# Patient Record
Sex: Female | Born: 1950 | Race: Black or African American | Hispanic: No | Marital: Married | Smoking: Former smoker
Health system: Southern US, Community
[De-identification: ages and names within clinical notes are randomized; demographics above are authoritative.]

## PROBLEM LIST (undated history)

## (undated) DIAGNOSIS — I10 Essential (primary) hypertension: Secondary | ICD-10-CM

## (undated) DIAGNOSIS — I2510 Atherosclerotic heart disease of native coronary artery without angina pectoris: Secondary | ICD-10-CM

## (undated) DIAGNOSIS — F32A Depression, unspecified: Secondary | ICD-10-CM

## (undated) DIAGNOSIS — F329 Major depressive disorder, single episode, unspecified: Secondary | ICD-10-CM

## (undated) DIAGNOSIS — F319 Bipolar disorder, unspecified: Secondary | ICD-10-CM

## (undated) DIAGNOSIS — F419 Anxiety disorder, unspecified: Secondary | ICD-10-CM

## (undated) HISTORY — PX: ABDOMINAL HYSTERECTOMY: SHX81

## (undated) HISTORY — DX: Atherosclerotic heart disease of native coronary artery without angina pectoris: I25.10

---

## 2000-08-04 ENCOUNTER — Other Ambulatory Visit: Admission: RE | Admit: 2000-08-04 | Discharge: 2000-08-04 | Payer: Self-pay | Admitting: Obstetrics and Gynecology

## 2001-03-22 ENCOUNTER — Encounter: Payer: Self-pay | Admitting: Pulmonary Disease

## 2001-03-22 ENCOUNTER — Ambulatory Visit (HOSPITAL_COMMUNITY): Admission: RE | Admit: 2001-03-22 | Discharge: 2001-03-22 | Payer: Self-pay | Admitting: Pulmonary Disease

## 2001-08-17 ENCOUNTER — Other Ambulatory Visit: Admission: RE | Admit: 2001-08-17 | Discharge: 2001-08-17 | Payer: Self-pay | Admitting: Obstetrics and Gynecology

## 2001-09-07 ENCOUNTER — Encounter: Payer: Self-pay | Admitting: Obstetrics and Gynecology

## 2001-09-07 ENCOUNTER — Encounter: Admission: RE | Admit: 2001-09-07 | Discharge: 2001-09-07 | Payer: Self-pay | Admitting: Obstetrics and Gynecology

## 2001-10-18 ENCOUNTER — Encounter: Payer: Self-pay | Admitting: *Deleted

## 2001-10-31 ENCOUNTER — Ambulatory Visit (HOSPITAL_COMMUNITY): Admission: RE | Admit: 2001-10-31 | Discharge: 2001-10-31 | Payer: Self-pay | Admitting: *Deleted

## 2002-03-20 ENCOUNTER — Inpatient Hospital Stay (HOSPITAL_COMMUNITY): Admission: EM | Admit: 2002-03-20 | Discharge: 2002-03-27 | Payer: Self-pay | Admitting: Psychiatry

## 2003-07-25 ENCOUNTER — Inpatient Hospital Stay (HOSPITAL_COMMUNITY): Admission: EM | Admit: 2003-07-25 | Discharge: 2003-07-31 | Payer: Self-pay | Admitting: Psychiatry

## 2004-07-28 ENCOUNTER — Ambulatory Visit: Payer: Self-pay | Admitting: Psychiatry

## 2004-07-28 ENCOUNTER — Inpatient Hospital Stay (HOSPITAL_COMMUNITY): Admission: RE | Admit: 2004-07-28 | Discharge: 2004-08-05 | Payer: Self-pay | Admitting: Psychiatry

## 2005-02-02 ENCOUNTER — Encounter (INDEPENDENT_AMBULATORY_CARE_PROVIDER_SITE_OTHER): Payer: Self-pay | Admitting: Cardiology

## 2005-02-02 ENCOUNTER — Ambulatory Visit (HOSPITAL_COMMUNITY): Admission: RE | Admit: 2005-02-02 | Discharge: 2005-02-02 | Payer: Self-pay | Admitting: Pulmonary Disease

## 2005-06-05 ENCOUNTER — Inpatient Hospital Stay (HOSPITAL_COMMUNITY): Admission: RE | Admit: 2005-06-05 | Discharge: 2005-06-10 | Payer: Self-pay | Admitting: Psychiatry

## 2005-06-06 ENCOUNTER — Ambulatory Visit: Payer: Self-pay | Admitting: Psychiatry

## 2006-10-26 ENCOUNTER — Ambulatory Visit: Payer: Self-pay | Admitting: *Deleted

## 2006-10-26 ENCOUNTER — Inpatient Hospital Stay (HOSPITAL_COMMUNITY): Admission: RE | Admit: 2006-10-26 | Discharge: 2006-11-02 | Payer: Self-pay | Admitting: *Deleted

## 2007-08-03 ENCOUNTER — Inpatient Hospital Stay (HOSPITAL_COMMUNITY): Admission: EM | Admit: 2007-08-03 | Discharge: 2007-08-05 | Payer: Self-pay | Admitting: Emergency Medicine

## 2007-08-04 ENCOUNTER — Encounter (INDEPENDENT_AMBULATORY_CARE_PROVIDER_SITE_OTHER): Payer: Self-pay | Admitting: Cardiovascular Disease

## 2008-07-29 ENCOUNTER — Encounter: Admission: RE | Admit: 2008-07-29 | Discharge: 2008-07-29 | Payer: Self-pay | Admitting: Orthopedic Surgery

## 2008-08-01 ENCOUNTER — Encounter: Admission: RE | Admit: 2008-08-01 | Discharge: 2008-08-01 | Payer: Self-pay | Admitting: Orthopedic Surgery

## 2008-09-17 IMAGING — US US ABDOMEN COMPLETE
1 series · 14 of 25 positions shown · non-contrast
Comparison: none

CLINICAL DATA: 56-year-old female, abdominal and chest pain. 
ABDOMEN ULTRASOUND:
TECHNIQUE: Complete abdominal ultrasound examination was performed including evaluation of the liver, gallbladder, bile ducts, pancreas, kidneys, spleen, IVC, and abdominal aorta.

[Series 1: unknown · 0.30mm/px · 14 of 58 slices shown]
[im 1/58]
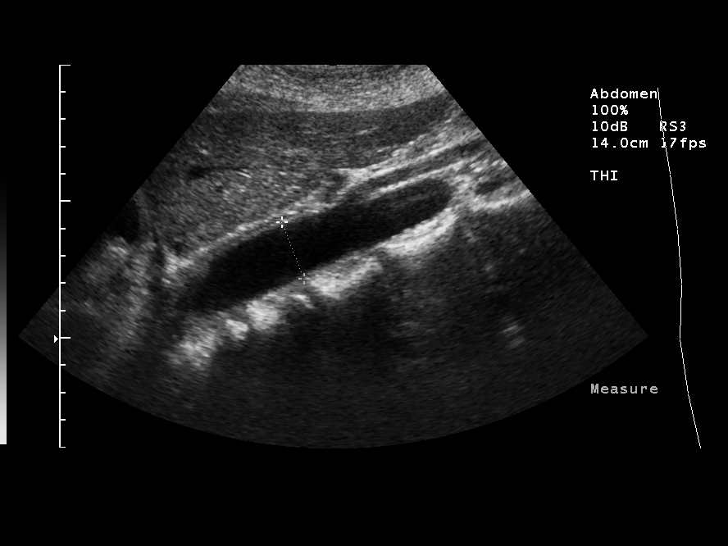
[im 5/58]
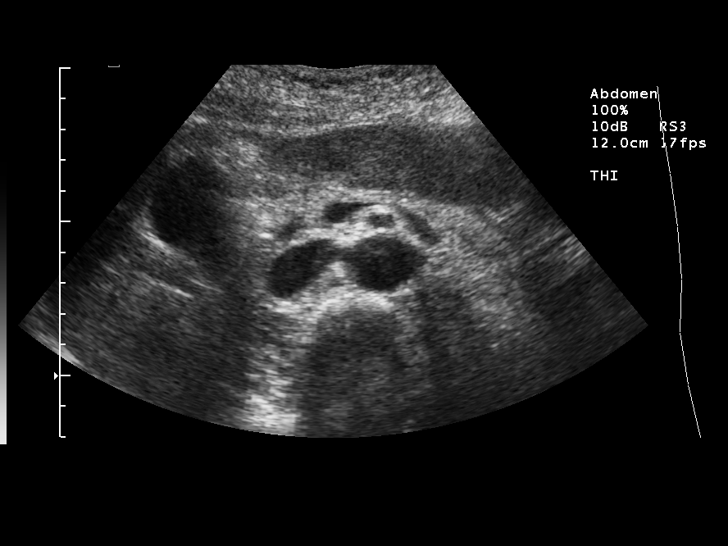
[im 10/58]
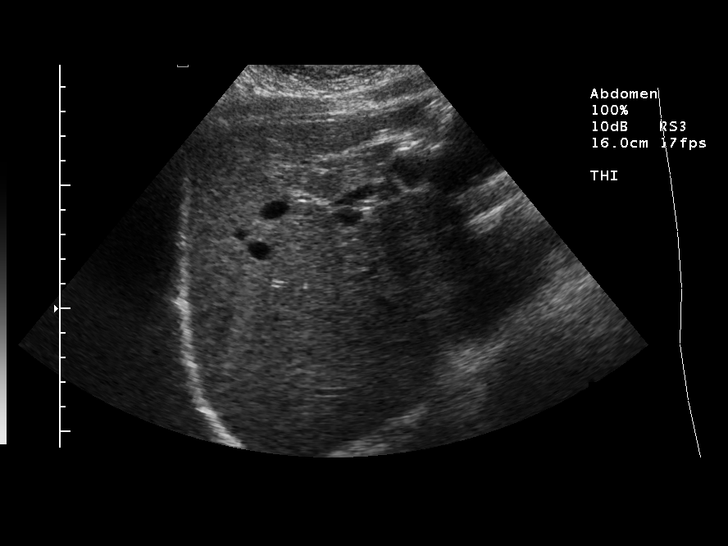
[im 15/58]
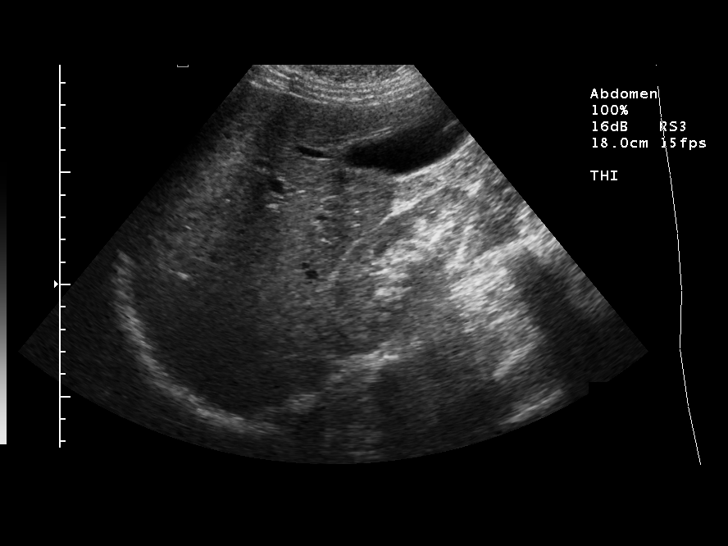
[im 20/58]
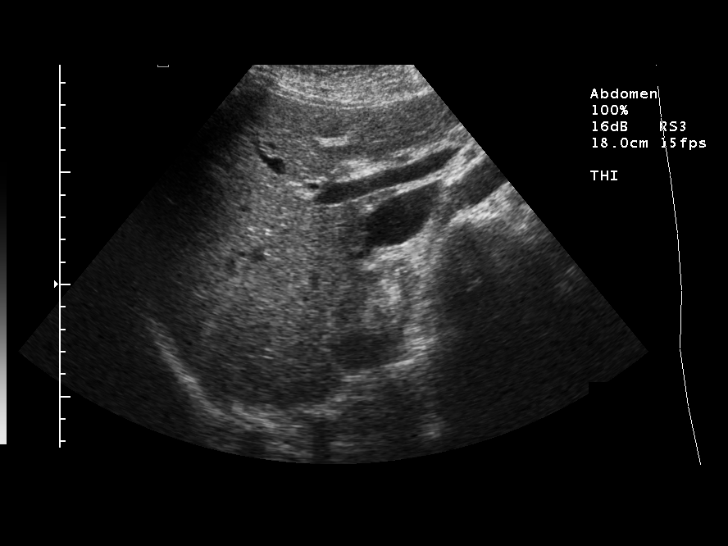
[im 22/58]
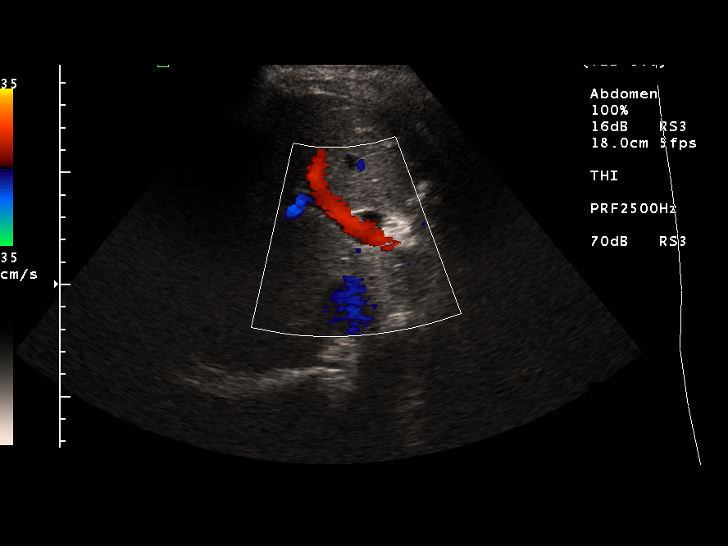
[im 27/58]
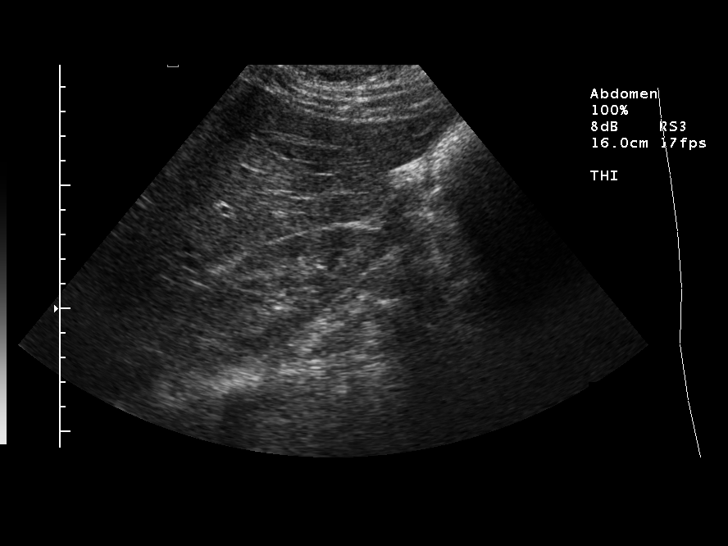
[im 31/58]
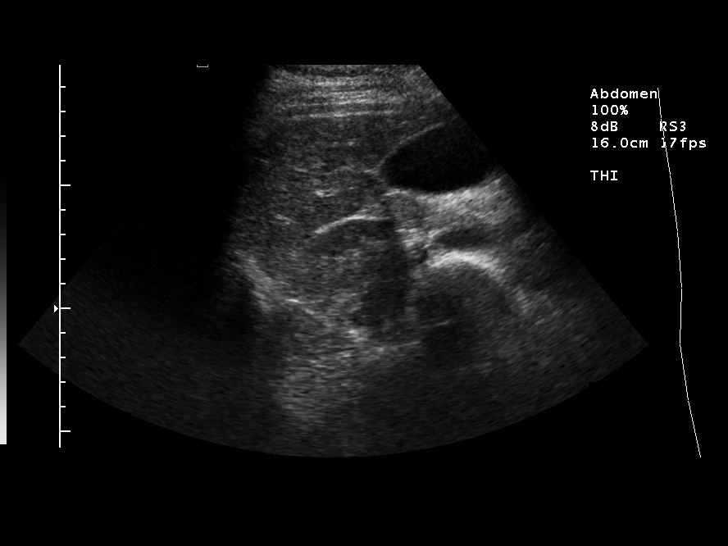
[im 36/58]
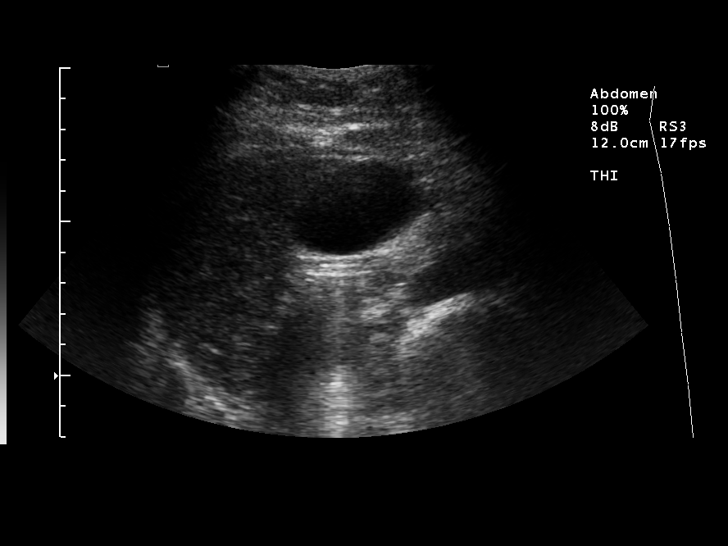
[im 39/58]
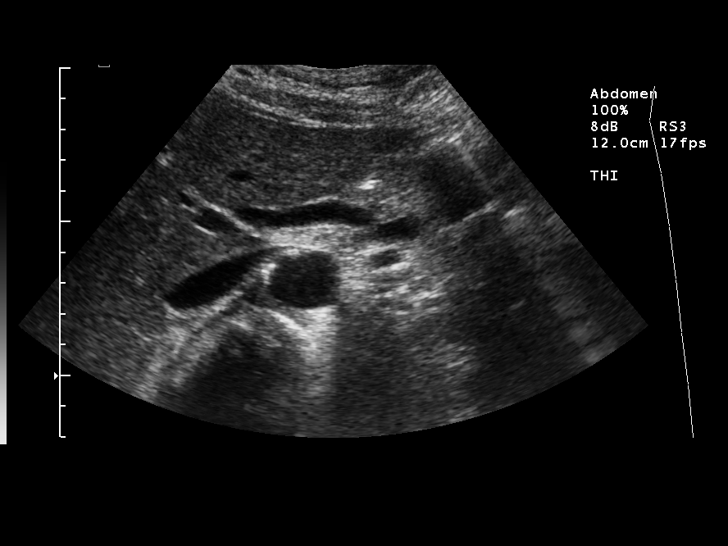
[im 43/58]
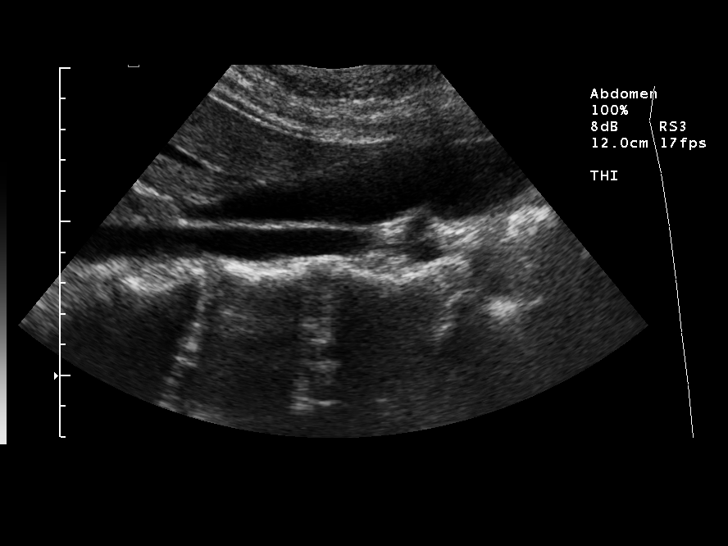
[im 48/58]
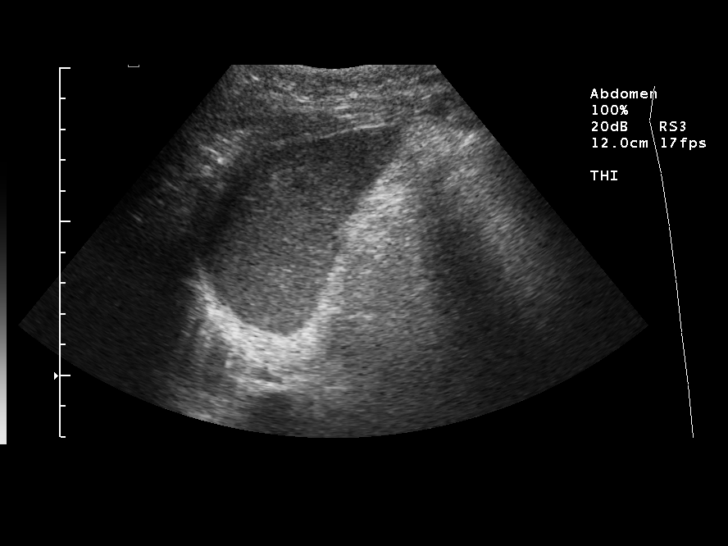
[im 53/58]
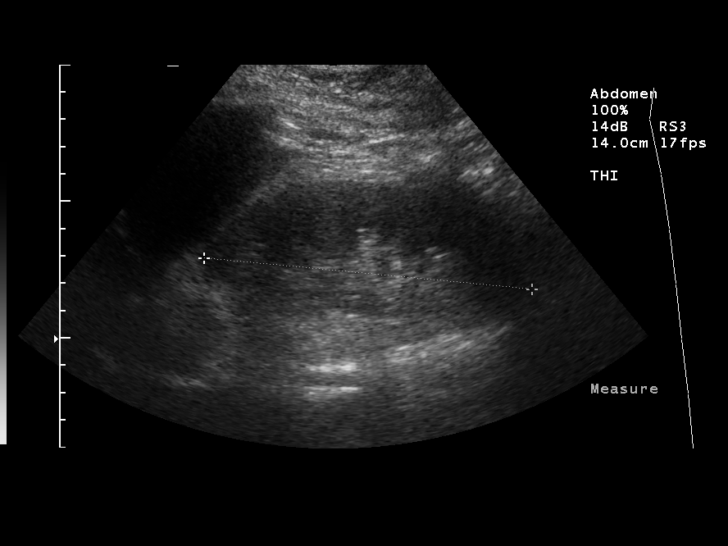
[im 58/58]
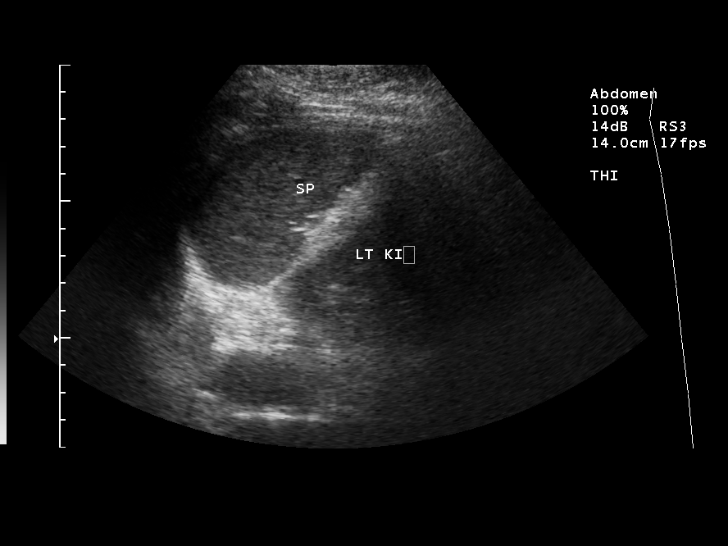

[14 of 25 positions shown; findings below may reference images not displayed]

FINDINGS: Gallbladder is normal in appearance without stone disease, wall thickening, or pericholecystic fluid.  Negative for sonographic Murphy sign.  Common bile duct diameter is normal measuring 6.4 mm.  Imaged portions of the liver, IVC, spleen, kidneys, and aorta are within normal limits.  No focal hepatic abnormality, ascites, or acute finding.  The kidneys demonstrate no evidence of hydronephrosis, dilatation, or perinephric fluid collection.  Slight prominence of the distal CBD in the pancreatic head region again measuring 6 mm. Imaging of the left kidney was limited because of difficulty obtaining a good acoustic window.
IMPRESSION: 1.  No acute finding.  Negative for gallstones or cholecystitis.  
2.  Prominent extrahepatic CBD in the pancreatic head region measuring 6 mm, nonspecific.

## 2010-04-20 ENCOUNTER — Other Ambulatory Visit: Admission: RE | Admit: 2010-04-20 | Discharge: 2010-04-20 | Payer: Self-pay | Admitting: Radiology

## 2011-02-09 NOTE — Cardiovascular Report (Signed)
Emily Murray, Emily Murray          ACCOUNT NO.:  [id number]   MEDICAL RECORD NO.:  [id number]          PATIENT TYPE:  INP   LOCATION:  2021                         FACILITY:  [facility]   PHYSICIAN:  Ricki Rodriguez, M.D.  DATE OF BIRTH:  1951/07/17   DATE OF PROCEDURE:  08/04/2007  DATE OF DISCHARGE:  08/05/2007                            CARDIAC CATHETERIZATION   REFERRING PHYSICIAN:  Dr. Corine Shelter.   PROCEDURE:  1. Left heart catheterization.  2. Selective coronary angiography.  3. Left ventricular function study.   INDICATIONS:  This 60 year old black female with a history of mild  hypertension not treated with any medications yet, had atypical chest  pain, abnormal EKG and abnormal nuclear stress test showing inferior  wall ischemia.   APPROACH:  Right femoral artery using 4-French sheath.   COMPLICATIONS:  None.   DYE:  Less than 40 mL of dye were used.   HEMODYNAMIC DATA:  The left ventricular pressure was 148/5 and aortic  pressure was 150/90.   CORONARY ANATOMY:  The left main coronary artery was very short and  unremarkable.   Left anterior descending coronary artery:  The left anterior descending  coronary artery had a proximal ectasia.  The mid and distal vessel was  unremarkable.  The distal vessel wrapped around the apex of the heart,  supplying 1/3 of the posterior septum.  The diagonal vessels were  unremarkable.   Left circumflex coronary artery:  The left circumflex coronary artery  was essentially unremarkable.  Its ramus artery was a very narrow vessel  and otherwise was unremarkable.  Its obtuse marginal branch #1 was a  small vessel and obtuse marginal branch #2 was unremarkable.   Right coronary artery:  The right coronary artery was dominant and  essentially unremarkable and its posterolateral branch and posterior  descending coronary arteries were also unremarkable.   LEFT VENTRICULOGRAM:  The left ventriculogram showed mild basal  inferior  wall hypokinesia with minimal dilatation of the left ventricle, ejection  fraction about 50% to 55%.   IMPRESSION:  1. Normal coronaries.  2. Preserved left ventricular systolic function.  3. Early hypertensive heart disease.   RECOMMENDATIONS:  This patient will be treated medically with addition  of ACE inhibitor or an ARB medication for blood pressure control.      Ricki Rodriguez, M.D.  Electronically Signed     ASK/MEDQ  D:  08/04/2007  T:  08/06/2007  Job:  [id number]

## 2011-02-12 NOTE — Discharge Summary (Signed)
Emily Murray, Emily Murray          ACCOUNT NO.:  [id number]   MEDICAL RECORD NO.:  [id number]          PATIENT TYPE:  INP   LOCATION:  2021                         FACILITY:  [facility]   PHYSICIAN:  Ricki Rodriguez, M.D.  DATE OF BIRTH:  1950-10-26   DATE OF ADMISSION:  08/03/2007  DATE OF DISCHARGE:  08/05/2007                               DISCHARGE SUMMARY   REFERRING PHYSICIAN:  Mina Marble, M.D.   FINAL DIAGNOSES:  1. Chest pain.  2. Hypertensive heart disease.  3. Manic-depressive disorder.   PRINCIPAL PROCEDURE:  Left heart catheterization, selective coronary  angiography, left ventricular function study done by Dr. Orpah Cobb on  August 04, 2007.   DISCHARGE MEDICATIONS:  1. Zoloft 100 mg one daily.  2. Premarin 0.625 mg one daily.  3. Risperdal 1 mg at bedtime.  4. Benicar HCT 20/12.5 mg half a tablet daily.   DISCHARGE ACTIVITY:  The patient to increase activity slowly.   DISCHARGE DIET:  Low-sodium heart-healthy diet with 1500 mL fluid  restriction per day.   SPECIAL INSTRUCTIONS:  The patient to stop any activity that causes  chest pain, shortness of breath, dizziness, sweating or excessive  weakness and decrease salt intake by 50% fluid and fluids by 30% of the  previous intact.   FOLLOWUP:  By Dr. Orpah Cobb in 2 weeks to 4 weeks and by Dr.  Petra Kuba in 1 month.   CONDITION ON DISCHARGE:  Improved.   HISTORY:  This 60 year old black female presented with atypical chest  pain described as retrosternal heaviness to indigestion along with a  right shoulder pain.   PHYSICAL EXAMINATION:  Temperature 97.6, pulse 65, respirations 20,  blood pressure 156/96, height 5 feet 3 inches, weight 140 pounds.  GENERAL:  The patient is averagely built and nourished, alert, oriented  x3.  HEENT:  The patient is normocephalic, atraumatic, has brown eyes.  Conjunctivae pink.  Sclerae nonicteric.  NECK:  No JVD, no carotid bruit.  Has full range of motion of  the neck.  LUNGS:  Clear bilaterally.  CHEST WALL:  Tender on palpation.  HEART:  Normal S1, S2.  ABDOMEN:  Soft and nontender.  EXTREMITIES:  Negative edema, cyanosis, clubbing.  SKIN:  Warm and dry.  NEUROLOGICALLY:  The patient moves all extremities.   LABORATORY DATA:  Normal hemoglobin/hematocrit, wbc count and platelet  count.  Normal electrolytes, BUN, creatinine.  Normal CK-MB and troponin  I.  Ultrasound of the abdomen revealed a prominent extrahepatic common  bile duct without evidence of gallstones or cholecystitis.  Chest x-ray:  No evidence of acute cardiopulmonary disease.  Nuclear stress test  revealed mild to moderate reversible ischemia in the inferior wall with  an ejection fraction of 52%.  Ultrasound of the heart revealed normal LV  systolic function with mild diastolic dysfunction, moderate tricuspid  regurgitation, mild mitral valve regurgitation.   Cardiac catheterization showed mild basal inferior wall hypokinesia,  otherwise normal coronaries.   HOSPITAL COURSE:  The patient was admitted to telemetry unit.  Myocardial infarction was ruled out.  She underwent nuclear stress test  that showed inferior wall ischemia.  Hence, she underwent cardiac  catheterization.  This showed normal coronaries.  However, there was  some evidence of basal inferior wall hypokinesia with an ejection  fraction of 50-55%.  The patient had uneventful stay and was given  instruction on salt intake reduction, controlling of fluid intake.  She  was started on a low dose of ACE inhibitor and she will be followed by  me and by primary care physician in 2-4 weeks.      Ricki Rodriguez, M.D.  Electronically Signed     ASK/MEDQ  D:  09/01/2007  T:  09/01/2007  Job:  [id number]
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
## 2011-02-12 NOTE — H&P (Signed)
[facility]  Patient:    Emily Murray, Emily Murray Visit Number: [id number] MRN: [id number]          Service Type: Attending:  Katy Fitch, M.D. Dictated by:   Katy Fitch, M.D. Adm. Date:  10/16/01                           History and Physical  CHIEF COMPLAINT:              Pelvic pain.  HISTORY OF PRESENT ILLNESS:   The patient is a 60 year old G4, P2, A2 who comes in to the office on January 10 noted to have left lower quadrant pain off and on for two months.  Patient was previously treated by Dr. Katrinka Blazing who has subsequently retired.  The patient previously at Dr. Lonn Georgia office was noted to have a cyst on her left ovary and was given pain medicine and it had resolved.  The patient states that this current pain has continued and has not improved with any type of movement, Tylenol, pain medicine, or moving her bowel or bladder.  The patient underwent ultrasound in the office.  Noted to have a left ovary which was higher in the abdomen.  Noted to be 3 x 1.5 x 1.3 which also had an echo-free cystic structure.  Secondary to this a CA-125 level was drawn which was noted to be 6.8.  Patient has opted to have definitive therapy and removal of her left tube and ovary currently.  PAST MEDICAL HISTORY:         Bipolar disorder.  PAST SURGICAL HISTORY:        Hysterectomy for fibroids in 1985, cyst removal in 1985 on the right side, ovary was removed on the right side in 1988.  MEDICATIONS:                  Zoloft, Depakote, ______ for her bipolar disorder.  ALLERGIES:                    No known drug allergies.  SOCIAL HISTORY:               Patient denies any tobacco, alcohol, or drugs.  FAMILY HISTORY:               Without any mental retardation, colon, breast, or ovarian cancer.  PHYSICAL EXAMINATION  VITAL SIGNS:                  Blood pressure 122/70.  HEENT:                        Throat:  Clear.  LUNGS:                        Clear  to auscultation bilaterally.  HEART:                        Regular rate and rhythm.  ABDOMEN:                      Soft, nontender.  PELVIC:                       Normal external genitalia.  Vaginal cuff is intact.  Right adnexa without tenderness.  However, left adnexa was extremely tender on deep palpation.  PLAN:  Laparoscopic left salpingo-oophorectomy. Patient was explained we will attempt to remove the ovary and tube laparoscopically.  However, due to previous surgery she potentially could have scar tissue which could impede removal through the scope and she has given [place] permission to do laparotomy.  Risks for the surgery include bleeding, transfusions, risk of hepatitis or HIV, infection, scar tissue, wound breakdown, damage to internal organs, fistula formation, blood clots, stroke, pulmonary embolism, or death.  The patient states she understands this and desires to proceed.  Will proceed on the 23rd. Dictated by:   Katy Fitch, M.D. Attending:  Katy Fitch, M.D. DD:  10/16/01 TD:  10/16/01 Job: [id number]

## 2011-02-12 NOTE — Discharge Summary (Signed)
NAME:  Emily Murray, Emily Murray                    ACCOUNT NO.:  [id number]   MEDICAL RECORD NO.:  [id number]                   PATIENT TYPE:  PS   LOCATION:  0506                                 FACILITY:  [facility]   PHYSICIAN:  Haskel Khan, M.D.           DATE OF BIRTH:  [birth year]   DATE OF ADMISSION:  03/20/2002  DATE OF DISCHARGE:  03/27/2002                                 DISCHARGE SUMMARY   IDENTIFYING DATA:  This is a 60 year old married African-American female  voluntarily admitted with a history of depression, unable to work, dropped  out of school, having thoughts of hurting others.  Husband reportedly doing  drugs.   PAST PSYCHIATRIC HISTORY:  The patient had been followed by Dr. Wynonia Lawman last  year and was hospitalized at [facility] two years ago with depression.  Possible history of bipolar disorder.   MEDICATIONS:  Premarin, Depakote, Zoloft.   ALLERGIES:  No known drug allergies.   PHYSICAL EXAMINATION:  Essentially within normal limits.  Neurologically  nonfocal.   LABORATORY DATA:  Routine admission labs within normal limits.   MENTAL STATUS EXAM:  Alert, cooperative female maintaining fair eye contact.  Speech within normal limits.  Mood depressed.  Affect restricted.  Thought  processes goal directed.  Thought content negative for dangerous ideation or  psychotic symptoms.  Cognitively intact.  Judgment and insight poor.   ADMISSION DIAGNOSES:   AXIS I:  Bipolar disorder, depressed.   AXIS II:  None.   AXIS III:  None.   AXIS IV:  Moderate (problems with primary support group).   AXIS V:  30/75.   HOSPITAL COURSE:  The patient was admitted and ordered routine p.r.n.  medications and underwent further monitoring.  The patient was resumed on  medications.  Zoloft was tapered due to possible mood swings and Depakote  optimized.  Depakote level was monitored and she was monitored for tolerance  and response.  Ativan was optimized to control acute  anxiety.  Family  session was held.  The patient reported some sedation with Zyprexa.   CONDITION ON DISCHARGE:  Improved.  Mood is more stable.  Affect brighter.  Thought processes goal directed.  Thought content negative for dangerous  ideation or psychotic symptoms.  The patient had improved judgment and  insight and reported motivation to be compliant with follow-up plan.    DISCHARGE MEDICATIONS:  1. Premarin.  2. Zoloft 100 mg q.a.m.  3. Depakote ER 500 mg, 2 q.h.s.  4. Ativan 0.5 mg, 1 t.i.d. and 2 q.h.s.  5. Tylenol No. 3, 1-2 every six hours p.r.n. pain.   FOLLOW UP:  Dr. Wynonia Lawman within one week after discharge.   DISCHARGE DIAGNOSES:   AXIS I:  Bipolar disorder, depressed.   AXIS II:  None.   AXIS III:  None.   AXIS IV:  Moderate (problems with primary support group).   AXIS V:  Global Assessment of Functioning on discharge 55.  Haskel Khan, M.D.    Lovie Macadamia  D:  05/03/2002  T:  05/06/2002  Job:  [id number]
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
## 2011-02-12 NOTE — Discharge Summary (Signed)
[name]  KENNIS, BUELL NO.:  [id number]   MEDICAL RECORD NO.:  [id number]          PATIENT TYPE:  IPS   LOCATION:  0306                          [facility]ILITY[facility]  [facility]   PHYSICIAN:  Jasmine Pang, M.D. DATE OF BIRTH:  1951/08/20   DATE OF ADMISSION:  10/26/2006  DATE OF DISCHARGE:  11/02/2006                               DISCHARGE SUMMARY   IDENTIFYING INFORMATION:  A 60 year old married African American female  who was admitted on a voluntary basis on October 26, 2006.   HISTORY OF PRESENT ILLNESS:  Patient has a history of bipolar disorder.  She presents with 4 weeks of malaise and physical fatigue that were  worsening.  She was unable to think clearly and get her work done.  She  works as a Optometrist at [employer].  She has been having  trouble coping with her ADLs, in addition to her physical for fatigue.  Patient sees Dr. Wynonia Lawman in [place] [facility].  This is one of several [facility] admissions.  The patient has a son  who has bipolar disorder and one sister with bipolar disorder.  Patient  is currently on Zoloft and Lamictal.  She has been med compliant.   ALLERGIES:  NO KNOWN DRUG ALLERGIES.   PHYSICAL FINDINGS:  No acute medical problems.   ADMISSION LABORATORY:  Urine drug screen positive for benzodiazepines  and cocaine and opiates.  Urinalysis was negative.  CBC was grossly  unremarkable.  Basic metabolic panel was within normal limits.  Liver  profile was remarkable for a low total protein of 5.6 (6-8.3) and a low  albumin of 3.1 (3.5-5.2.  TSH and hypothyroid panel were within normal  limits.  B12 was normal at 593, RBC folate was slightly elevated at 677  (180-600).   HOSPITAL COURSE:  Upon admission, patient was continued on her Premarin  0.625 mg daily.  The other doses were being clarified.  On October 26, 2006, the patient is started on Wellbutrin XL 150 mg daily, Lamictal 200  mg p.o. b.i.d., Tylenol 3 one  p.o. q.6-8 h. p.r.n. headache, Ambien 10  mg p.o. nightly p.r.n. insomnia.  On October 27, 2006, she was placed on  Zomig 40 mEq p.o. onset of migraine, may repeat times 1 in 2 hours per  headache.  On October 27, 2006, Lamictal was discontinued and instead  she was placed on Lamictal 200 mg p.o. q.a.m.  She was also placed on  Risperdal 0.5 mg p.o. nightly.  On October 28, 2006, patient was  restarted on her Zoloft 200 mg daily.  On October 30, 2006, Wellbutrin  XL was increased from 150 mg daily to 300 mg daily.  On November 01, 2006, Ambien was increased to 20 mg p.o. nightly.  Patient tolerated her  medications well with no significant side effects.   Upon first meeting with the patient on October 27, 2006, she stated she  had been taking her medicine.  She described having difficulty with low  energy and problems at work with concentration.  There were also  stressors at home.  She and  her husband were trying to buy a home and  this was very stressful.  On October 28, 2006, patient slept well,  appetite was good.  She stated she felt her problems were insignificant.  She was aware that her husband did not feel she was ready for discharge.  She states she should have gone to Dr. Wynonia Lawman earlier when she noticed  she was starting to feel bad.  She wanted to be back on her Zoloft 200  mg daily, which inadvertently did not get started.  She talked with work  today and has a very Public house manager.   On October 29, 2006, patient slept well with the Ambien.  She is very  pleasant and cooperative.  She stated she was feeling better.  Her boss  called her yesterday and was very supportive.  Family has been very  supportive, but she feels her son is a little too intrusive I just want  my energy back.   On October 30, 2006, patient was not feeling as good today.  Her husband  visited yesterday.  She was worried about his diet since he is diabetic  and not taking care of himself.  She feels  overwhelmed by family members  wanting to know how she is doing.  She is worried about her bills  getting paid.  Last night she had early morning awakening at 4 o'clock  a.m.   On October 31, 2006, patient stated she was not sleeping well again.  She was nervous about going home because her husband depends on her so  much.  She planned to clarify this in the family session that I won't  be at the top of my game when she goes home, so he will not have  expectations greater for her than are reasonable.  She did admit to  using cocaine 2 weeks ago trying to energize herself.  Ambien was  increased to 20 mg p.o. nightly.   On November 01, 2006, patient was upset because her husband been unable  to come for the family session the day before.  He was working on trying  to buy a house.  She states she woke up early in the morning again,  appetite was decreased.  She still wants to talk with her husband about  not being able to be better immediately.  She plans to take off work  after discharge to try to feel more stable. On November 01, 2006, patient  has had a family session with her husband.  He was very supportive.  He  stated he was going to help her with setting limits with her children.  The patient's husband stated that patient has a strong support system in  place.  She has a job that she enjoys and she was able to talk to her  husband about wanting his patience with her when she returns home.  She  also agreed to follow up with her psychiatrist.   On November 02, 2006, mental status had improved markedly from admission  status.  She was friendly, cooperative, conversant with good eye  contact.  Speech normal rate and flow.  Psychomotor activity was within  normal limits.  Her mood was euthymic, though somewhat anxious.  Affect  wide range.  No suicidal or homicidal ideation.  No thoughts of self- injurious behavior.  No auditory or visual hallucinations.  No paranoia  or delusions.   Thoughts were logical and goal-directed.  Thought  content:  No predominant theme  and the cognitive was grossly back to  baseline.   DISCHARGE DIAGNOSES:  AXIS I:  Bipolar disorder, depressed mood, severe,  without psychosis.  AXIS II:  None.  AXIS III:  Migraine headaches.  AXIS IV:  Severe (problems with primary support group, problems related  to social environment, occupational problem, housing problem, medical  problems and burden of psychiatric illness.  AXIS V: Global assessment of functioning upon discharge was 50.  Global  assessment of functioning upon admission 35, global assessment of  functioning highest past year 75+.   DISCHARGE PLAN:  There were no specific activity level or dietary  restrictions.   DISCHARGE MEDICATIONS:  1. Risperdal 0.5 mg p.o. nightly.  2. Premarin 0.625 mg daily.  3. Lamictal 200 mg daily.  4. Wellbutrin XL 300 mg daily.  5. Ambien 20 mg daily p.r.n. insomnia.  6. Tylenol with Codeine No. 3 one tablet every 6 hours as needed for      pain.   POST-HOSPITAL CARE PLANS:  The patient will return to see Donnie Aho  at the [facility] on Wednesday February 13 at 3  o'clock p.m.      Jasmine Pang, M.D.  Electronically Signed     BHS/MEDQ  D:  11/02/2006  T:  11/02/2006  Job:  [id number]

## 2011-02-12 NOTE — Op Note (Signed)
[facility]  Patient:    Emily Murray, Emily Murray Visit Number: [id number] MRN: [id number]          Service Type: DSU Location: [facility] Attending Physician:  Wetzel Bjornstad Dictated by:   Katy Fitch, M.D. Proc. Date: 10/31/01 Admit Date:  10/31/2001                             Operative Report  PREOPERATIVE DIAGNOSES:       Pelvic pain.  POSTOPERATIVE DIAGNOSES:      1. Ovarian remnant.                               2. Bowel adhesion to the right Falope ring.  PROCEDURE:                    1. Laparoscopic ______.                               2. CO2 laser ablation of ovarian remnant.  SURGEON:                      Katy Fitch, M.D.  ANESTHESIA:                   General.  ESTIMATED BLOOD LOSS:         None.  URINE OUTPUT:                 250 clear.  FLUIDS:                       1600 Crystalloid.  COMPLICATIONS:                None.  SPECIMEN:                     None.  FINDINGS:                     Left descending colon bowel adhesion to the right fallopian ring.  The patient also had a left ovarian remnant.  Patient was supposed to have, according to history, a right salpingo-oophorectomy. This patient appeared to have just a mild ovarian remnant in her left peritoneum which is just inferior to her ureter.  PROCEDURE:                    Patient was taken to the operating room and placed under general anesthesia.  The patient was placed in Gunnison stirrups and prepped and draped in a normal sterile fashion.  A Foley catheter was inserted in the bladder and clear urine return was noted.  Sponge stick was then placed into the vagina up against the vaginal cuff.  A 1 cm vertical umbilical incision was made with a 15 blade and a Verres needle was inserted in the abdominal cavity after abdominal wall tenting manually.  CO2 gas was then instilled into the abdomen until 15 mmHg was noted of pressure and the abdomen was tympanitic.  The  Verres needle was removed and a 10 mm trocar was inserted.  After abdominal wall tenting the camera device was assembled and the patient was placed in Trendelenburg and the above findings were then noted.  At this point a 5 mm suprapubic port was  placed under direct visualization.  A monopolar scissors was then used to lyse the bowel adhesion from the Falope ring without difficulty.  Hemostasis was noted.  The bowel appeared to be intact.  At this point the left ovary was visualized and the ureter was noted to course just superior to this.  At this point it was felt that trying to dig the remnant from the peritoneum could be potentially nonbeneficial and more problematic both operatively and postoperatively.  It was felt at this point we can CO2 laser the remnant with good visualization of the ureter and destroy the remainder of what ovarian tissue was in the peritoneal lining.  The laser device was assembled and 10 watts of ultrapulse was performed and laser test was also performed and noted to be working correctly.  The area of the ovary was outlined with the laser, then cauterized completely to a depth of approximately 2 mm.  At this point all hemostasis was noted.  The ureter was also noted to be peristalsing on both sides.  The 5 mm port was then removed under direct visualization, then the 10 mm port removed. The fascia was closed using a 0 Vicryl stitch in a figure-of-eight and the skin closed using 4-0 in a subcuticular fashion.  Patient was taken to the recovery room in stable condition. Dictated by:   Katy Fitch, M.D. Attending Physician:  Wetzel Bjornstad DD:  10/31/01 TD:  11/01/01 Job: [id number]
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
## 2011-07-06 LAB — POCT CARDIAC MARKERS
CKMB, poc: 1
CKMB, poc: <1 — ABNORMAL LOW
Myoglobin, poc: 61.5
Operator id: 146091
Troponin i, poc: <0.05

## 2011-07-06 LAB — COMPREHENSIVE METABOLIC PANEL
ALT: 15
AST: 18
Albumin: 3.9
Alkaline Phosphatase: 60
BUN: 13
CO2: 25
Calcium: 9.5
Chloride: 104
Creatinine, Ser: 0.78
GFR calc Af Amer: >60
GFR calc non Af Amer: >60
Glucose, Bld: 75
Potassium: 3.7
Sodium: 138
Total Bilirubin: 0.4
Total Protein: 6.3

## 2011-07-06 LAB — CBC
HCT: 39
Hemoglobin: 12.9
Hemoglobin: 14.1
MCHC: 33.1
MCV: 81.7
Platelets: 209
Platelets: 262
RBC: 5.22 — ABNORMAL HIGH
RDW: 15.7 — ABNORMAL HIGH
WBC: 4.7

## 2011-07-06 LAB — BASIC METABOLIC PANEL
Calcium: 8.8
GFR calc Af Amer: >60
GFR calc non Af Amer: >60
Glucose, Bld: 78
Sodium: 137

## 2011-07-06 LAB — DIFFERENTIAL
Basophils Absolute: 0
Lymphocytes Relative: 31
Monocytes Relative: 6
Neutro Abs: 3.3
Neutrophils Relative %: 62

## 2011-07-06 LAB — D-DIMER, QUANTITATIVE: D-Dimer, Quant: 0.24

## 2011-07-06 LAB — TROPONIN I: Troponin I: 0.02

## 2011-07-06 LAB — CK TOTAL AND CKMB (NOT AT ARMC)
CK, MB: 1.6
CK, MB: 1.8
Total CK: 105
Total CK: 109

## 2011-07-06 LAB — LIPID PANEL
LDL Cholesterol: 68
Total CHOL/HDL Ratio: 2.3
Triglycerides: 32
VLDL: 6

## 2011-07-06 LAB — TSH: TSH: 3.673

## 2011-07-06 LAB — MAGNESIUM: Magnesium: 1.8

## 2011-07-06 LAB — HEPARIN LEVEL (UNFRACTIONATED): Heparin Unfractionated: 0.74 — ABNORMAL HIGH

## 2011-07-06 LAB — PROTIME-INR: Prothrombin Time: 13.7

## 2011-08-23 ENCOUNTER — Emergency Department (HOSPITAL_COMMUNITY): Payer: Medicare Other

## 2011-08-23 ENCOUNTER — Encounter: Payer: Self-pay | Admitting: Emergency Medicine

## 2011-08-23 ENCOUNTER — Emergency Department (HOSPITAL_COMMUNITY)
Admission: EM | Admit: 2011-08-23 | Discharge: 2011-08-24 | Disposition: A | Discharge status: Home / Self Care | Payer: Medicare Other | Attending: Emergency Medicine | Admitting: Emergency Medicine

## 2011-08-23 DIAGNOSIS — R5383 Other fatigue: Secondary | ICD-10-CM | POA: Insufficient documentation

## 2011-08-23 DIAGNOSIS — I2510 Atherosclerotic heart disease of native coronary artery without angina pectoris: Secondary | ICD-10-CM | POA: Insufficient documentation

## 2011-08-23 DIAGNOSIS — N390 Urinary tract infection, site not specified: Secondary | ICD-10-CM | POA: Insufficient documentation

## 2011-08-23 DIAGNOSIS — R4789 Other speech disturbances: Secondary | ICD-10-CM | POA: Insufficient documentation

## 2011-08-23 DIAGNOSIS — F985 Adult onset fluency disorder: Secondary | ICD-10-CM | POA: Insufficient documentation

## 2011-08-23 DIAGNOSIS — Z79899 Other long term (current) drug therapy: Secondary | ICD-10-CM | POA: Insufficient documentation

## 2011-08-23 DIAGNOSIS — F341 Dysthymic disorder: Secondary | ICD-10-CM | POA: Insufficient documentation

## 2011-08-23 DIAGNOSIS — R5381 Other malaise: Secondary | ICD-10-CM | POA: Insufficient documentation

## 2011-08-23 DIAGNOSIS — F319 Bipolar disorder, unspecified: Secondary | ICD-10-CM | POA: Insufficient documentation

## 2011-08-23 DIAGNOSIS — I10 Essential (primary) hypertension: Secondary | ICD-10-CM | POA: Insufficient documentation

## 2011-08-23 HISTORY — DX: Atherosclerotic heart disease of native coronary artery without angina pectoris: I25.10

## 2011-08-23 HISTORY — DX: Essential (primary) hypertension: I10

## 2011-08-23 HISTORY — DX: Depression, unspecified: F32.A

## 2011-08-23 HISTORY — DX: Anxiety disorder, unspecified: F41.9

## 2011-08-23 HISTORY — DX: Major depressive disorder, single episode, unspecified: F32.9

## 2011-08-23 HISTORY — DX: Bipolar disorder, unspecified: F31.9

## 2011-08-23 NOTE — ED Notes (Signed)
Pt reports having weakness, lethargic and having no energy x 2 weeks.  Went to her PCP with no results.  States that she has gained 10-11 pounds in 3 weeks.  Also reports increased in stuttering speech.  Pt denies pain, denies trouble thinking or speaking-just states that it is slowed.  No slurred speech noted.  CVA screen negative.  Skin warm, dry and intact.  Neuro intact.

## 2011-08-23 NOTE — ED Notes (Signed)
A;so states that she has gained 10# in the last couple of weaks

## 2011-08-23 NOTE — ED Notes (Signed)
Weakness and speech bad x 2 weeks has been getting worse no energy  studders any way but it has gotten worse

## 2011-08-24 LAB — URINALYSIS, ROUTINE W REFLEX MICROSCOPIC
Bilirubin Urine: NEGATIVE
Glucose, UA: NEGATIVE mg/dL
Ketones, ur: NEGATIVE mg/dL
Nitrite: NEGATIVE
Protein, ur: NEGATIVE mg/dL
Specific Gravity, Urine: 1.023 (ref 1.005–1.030)
Urobilinogen, UA: 0.2 mg/dL (ref 0.0–1.0)
pH: 5.5 (ref 5.0–8.0)

## 2011-08-24 LAB — T4: T4, Total: 9.6 ug/dL (ref 5.0–12.5)

## 2011-08-24 LAB — BASIC METABOLIC PANEL
BUN: 11 mg/dL (ref 6–23)
CO2: 24 mEq/L (ref 19–32)
Calcium: 9.5 mg/dL (ref 8.4–10.5)
Chloride: 103 mEq/L (ref 96–112)
Creatinine, Ser: 0.78 mg/dL (ref 0.50–1.10)
GFR calc Af Amer: >90 mL/min (ref >90)
GFR calc non Af Amer: 88 mL/min — ABNORMAL LOW (ref >90)
Glucose, Bld: 85 mg/dL (ref 70–99)
Potassium: 3.8 mEq/L (ref 3.5–5.1)
Sodium: 137 mEq/L (ref 135–145)

## 2011-08-24 LAB — CBC
HCT: 39.6 % (ref 36.0–46.0)
Hemoglobin: 13.5 g/dL (ref 12.0–15.0)
MCH: 28 pg (ref 26.0–34.0)
MCHC: 34.1 g/dL (ref 30.0–36.0)
MCV: 82 fL (ref 78.0–100.0)
Platelets: 268 10*3/uL (ref 150–400)
RBC: 4.83 MIL/uL (ref 3.87–5.11)
RDW: 14.2 % (ref 11.5–15.5)
WBC: 5.6 10*3/uL (ref 4.0–10.5)

## 2011-08-24 LAB — URINE MICROSCOPIC-ADD ON

## 2011-08-24 LAB — DIFFERENTIAL
Basophils Absolute: 0 10*3/uL (ref 0.0–0.1)
Basophils Relative: 0 % (ref 0–1)
Eosinophils Absolute: 0.2 10*3/uL (ref 0.0–0.7)
Eosinophils Relative: 4 % (ref 0–5)
Lymphocytes Relative: 49 % — ABNORMAL HIGH (ref 12–46)
Lymphs Abs: 2.7 10*3/uL (ref 0.7–4.0)
Monocytes Absolute: 0.4 10*3/uL (ref 0.1–1.0)
Monocytes Relative: 7 % (ref 3–12)
Neutro Abs: 2.2 10*3/uL (ref 1.7–7.7)
Neutrophils Relative %: 40 % — ABNORMAL LOW (ref 43–77)

## 2011-08-24 LAB — T3: T3, Total: 124.1 ng/dl (ref 80.0–204.0)

## 2011-08-24 LAB — TSH: TSH: 4.674 u[IU]/mL — ABNORMAL HIGH (ref 0.350–4.500)

## 2011-08-24 MED ORDER — CIPROFLOXACIN HCL 500 MG PO TABS
500.0000 mg | ORAL_TABLET | Freq: Once | ORAL | Status: AC
  Administered 2011-08-24: 500 mg via ORAL
  Filled 2011-08-24: qty 1

## 2011-08-24 MED ORDER — CIPROFLOXACIN HCL 500 MG PO TABS: 500.0000 mg | ORAL_TABLET | Freq: Two times a day (BID) | ORAL | Status: AC

## 2011-08-24 NOTE — ED Provider Notes (Signed)
History     CSN: [id number] Arrival date & time: 08/23/2011  6:55 PM   First MD Initiated Contact with Patient 08/23/11 2155      Chief Complaint  Patient presents with  . Weakness     Patient is a 60 y.o. female presenting with weakness. The history is provided by the patient.  Weakness The primary symptoms include speech change. Primary symptoms do not include headaches, syncope, loss of consciousness, altered mental status, seizures, dizziness, visual change, focal weakness, fever, nausea or vomiting. The symptoms began more than 1 week ago. The symptoms are unchanged.  Additional symptoms include weakness.  Patient reports increased weakness over the last 2 weeks. Reports that she stutters and her stuttering has become worse over that same period of time. Spouse states that she's just been getting weaker moving slower. Patient denies any other specific complaints.  Past Medical History  Diagnosis Date  . Hypertension   . Coronary artery disease   . Depressed   . Anxiety   . Bipolar 1 disorder     No past surgical history on file.  No family history on file.  History  Substance Use Topics  . Smoking status: Not on file  . Smokeless tobacco: Not on file  . Alcohol Use: No    OB History    Grav Para Term Preterm Abortions TAB SAB Ect Mult Living                  Review of Systems  Constitutional: Negative for fever.  HENT: Negative.   Eyes: Negative.   Respiratory: Negative.   Cardiovascular: Negative.  Negative for syncope.  Gastrointestinal: Negative.  Negative for nausea and vomiting.  Genitourinary: Negative.   Musculoskeletal: Negative.   Skin: Negative.   Neurological: Positive for speech change and weakness. Negative for dizziness, focal weakness, seizures, loss of consciousness and headaches.  Hematological: Negative.   Psychiatric/Behavioral: Negative.  Negative for altered mental status.    Allergies  Review of patient's allergies indicates no  known allergies.  Home Medications   Current Outpatient Rx  Name Route Sig Dispense Refill  . BUPROPION HCL ER (XL) 300 MG PO TB24 Oral Take 300 mg by mouth daily.      Marland Kitchen CLONAZEPAM 0.5 MG PO TABS Oral Take 0.5 mg by mouth 2 (two) times daily as needed. For aneixty     . ESTROGENS CONJ SYNTHETIC A 0.625 MG PO TABS Oral Take 0.625 mg by mouth daily.      Marland Kitchen OLMESARTAN MEDOXOMIL-HCTZ 40-12.5 MG PO TABS Oral Take 1 tablet by mouth daily.      . SERTRALINE HCL 100 MG PO TABS Oral Take 200 mg by mouth daily after breakfast.      . ZIPRASIDONE HCL 80 MG PO CAPS Oral Take 80 mg by mouth every evening.      Marland Kitchen CIPROFLOXACIN HCL 500 MG PO TABS Oral Take 1 tablet (500 mg total) by mouth 2 (two) times daily. 10 tablet 0    BP 97/68  Pulse 72  Temp(Src) 97 F (36.1 C) (Oral)  Resp 20  SpO2 99%  Physical Exam  Constitutional: She is oriented to person, place, and time. She appears well-developed and well-nourished.  HENT:  Head: Normocephalic and atraumatic.  Eyes: Conjunctivae and EOM are normal. Pupils are equal, round, and reactive to light.  Neck: Normal range of motion. Neck supple.  Cardiovascular: Normal rate and regular rhythm.   Pulmonary/Chest: Effort normal and breath sounds normal.  Abdominal:  Soft. Bowel sounds are normal.  Musculoskeletal: Normal range of motion.  Neurological: She is alert and oriented to person, place, and time. She has normal strength and normal reflexes. No cranial nerve deficit or sensory deficit. She displays a negative Romberg sign. Coordination normal. GCS eye subscore is 4. GCS verbal subscore is 5. GCS motor subscore is 6.  Skin: Skin is warm and dry.  Psychiatric: She has a normal mood and affect.    ED Course  Procedures  Findings and impression discussed with patient and spouse. We'll plan for discharge home with treatment for urinary tract infection and encourage followup with her primary care physician this week. Patient and spouse are agreeable  with plan.  Labs Reviewed  DIFFERENTIAL - Abnormal; Notable for the following:    Neutrophils Relative 40 (*)    Lymphocytes Relative 49 (*)    All other components within normal limits  BASIC METABOLIC PANEL - Abnormal; Notable for the following:    GFR calc non Af Amer 88 (*)    All other components within normal limits  URINALYSIS, ROUTINE W REFLEX MICROSCOPIC - Abnormal; Notable for the following:    Appearance CLOUDY (*)    Hgb urine dipstick TRACE (*)    Leukocytes, UA TRACE (*)    All other components within normal limits  TSH - Abnormal; Notable for the following:    TSH 4.674 (*)    All other components within normal limits  URINE MICROSCOPIC-ADD ON - Abnormal; Notable for the following:    Squamous Epithelial / LPF MANY (*)    Bacteria, UA MANY (*)    All other components within normal limits  CBC  LAB REPORT - SCANNED   Dg Chest 2 View  08/23/2011  *RADIOLOGY REPORT*  Clinical Data: Weakness and lethargy.  CHEST - 2 VIEW  Comparison: Chest radiograph performed 08/03/2007  Findings: The lungs are well-aerated and clear.  There is no evidence of focal opacification, pleural effusion or pneumothorax.  The heart is normal in size; the mediastinal contour is within normal limits.  No acute osseous abnormalities are seen.  IMPRESSION: No acute cardiopulmonary process seen.  Original Report Authenticated By: Tonia Ghent, M.D.   Ct Head Wo Contrast  08/23/2011  *RADIOLOGY REPORT*  Clinical Data: Generalized weakness and fatigue; worsening stuttering.  CT HEAD WITHOUT CONTRAST  Technique:  Contiguous axial images were obtained from the base of the skull through the vertex without contrast.  Comparison: None.  Findings: There is no evidence of acute infarction, mass lesion, or intra- or extra-axial hemorrhage on CT.  The posterior fossa, including the cerebellum, brainstem and fourth ventricle, is within normal limits.  The third and lateral ventricles, and basal ganglia are  unremarkable in appearance.  The cerebral hemispheres are symmetric in appearance, with normal gray- white differentiation.  No mass effect or midline shift is seen.  There is no evidence of fracture; visualized osseous structures are unremarkable in appearance.  The visualized portions of the orbits are within normal limits.  The paranasal sinuses and mastoid air cells are well-aerated.  No significant soft tissue abnormalities are seen.  IMPRESSION: Unremarkable noncontrast CT of the head.  Original Report Authenticated By: Tonia Ghent, M.D.     1. Urinary tract infection     Medical screening examination/treatment/procedure(s) were performed by non-physician practitioner and as supervising physician I was immediately available for consultation/collaboration. Carleene Cooper III, M.D.   MDM  No focal neuro deficits, . Ct normal, bloodwork w/o acute findings. UTI  Leanne Chang, NP 08/24/11 0142  Carleene Cooper III, MD 08/24/11 [phone]

## 2011-11-25 DIAGNOSIS — E559 Vitamin D deficiency, unspecified: Secondary | ICD-10-CM | POA: Diagnosis not present

## 2011-11-25 DIAGNOSIS — E039 Hypothyroidism, unspecified: Secondary | ICD-10-CM | POA: Diagnosis not present

## 2011-11-25 DIAGNOSIS — I119 Hypertensive heart disease without heart failure: Secondary | ICD-10-CM | POA: Diagnosis not present

## 2011-11-25 DIAGNOSIS — G43909 Migraine, unspecified, not intractable, without status migrainosus: Secondary | ICD-10-CM | POA: Diagnosis not present

## 2011-12-21 DIAGNOSIS — F4323 Adjustment disorder with mixed anxiety and depressed mood: Secondary | ICD-10-CM | POA: Diagnosis not present

## 2012-03-16 DIAGNOSIS — F4323 Adjustment disorder with mixed anxiety and depressed mood: Secondary | ICD-10-CM | POA: Diagnosis not present

## 2012-04-27 DIAGNOSIS — Z09 Encounter for follow-up examination after completed treatment for conditions other than malignant neoplasm: Secondary | ICD-10-CM | POA: Diagnosis not present

## 2012-04-27 DIAGNOSIS — N63 Unspecified lump in unspecified breast: Secondary | ICD-10-CM | POA: Diagnosis not present

## 2012-05-11 DIAGNOSIS — E559 Vitamin D deficiency, unspecified: Secondary | ICD-10-CM | POA: Diagnosis not present

## 2012-05-11 DIAGNOSIS — Z8249 Family history of ischemic heart disease and other diseases of the circulatory system: Secondary | ICD-10-CM | POA: Diagnosis not present

## 2012-05-11 DIAGNOSIS — E669 Obesity, unspecified: Secondary | ICD-10-CM | POA: Diagnosis not present

## 2012-05-11 DIAGNOSIS — I119 Hypertensive heart disease without heart failure: Secondary | ICD-10-CM | POA: Diagnosis not present

## 2012-05-11 DIAGNOSIS — E039 Hypothyroidism, unspecified: Secondary | ICD-10-CM | POA: Diagnosis not present

## 2012-05-11 DIAGNOSIS — Z79899 Other long term (current) drug therapy: Secondary | ICD-10-CM | POA: Diagnosis not present

## 2012-06-22 DIAGNOSIS — L819 Disorder of pigmentation, unspecified: Secondary | ICD-10-CM | POA: Diagnosis not present

## 2012-06-29 DIAGNOSIS — Z01419 Encounter for gynecological examination (general) (routine) without abnormal findings: Secondary | ICD-10-CM | POA: Diagnosis not present

## 2012-06-29 DIAGNOSIS — R1032 Left lower quadrant pain: Secondary | ICD-10-CM | POA: Diagnosis not present

## 2012-06-29 DIAGNOSIS — Z124 Encounter for screening for malignant neoplasm of cervix: Secondary | ICD-10-CM | POA: Diagnosis not present

## 2012-07-11 DIAGNOSIS — Z23 Encounter for immunization: Secondary | ICD-10-CM | POA: Diagnosis not present

## 2012-07-19 DIAGNOSIS — F4323 Adjustment disorder with mixed anxiety and depressed mood: Secondary | ICD-10-CM | POA: Diagnosis not present

## 2012-09-07 DIAGNOSIS — Z79899 Other long term (current) drug therapy: Secondary | ICD-10-CM | POA: Diagnosis not present

## 2012-09-07 DIAGNOSIS — E559 Vitamin D deficiency, unspecified: Secondary | ICD-10-CM | POA: Diagnosis not present

## 2012-09-07 DIAGNOSIS — E669 Obesity, unspecified: Secondary | ICD-10-CM | POA: Diagnosis not present

## 2012-09-07 DIAGNOSIS — E039 Hypothyroidism, unspecified: Secondary | ICD-10-CM | POA: Diagnosis not present

## 2012-11-16 DIAGNOSIS — F4323 Adjustment disorder with mixed anxiety and depressed mood: Secondary | ICD-10-CM | POA: Diagnosis not present

## 2012-12-28 DIAGNOSIS — R109 Unspecified abdominal pain: Secondary | ICD-10-CM | POA: Diagnosis not present

## 2012-12-28 DIAGNOSIS — L84 Corns and callosities: Secondary | ICD-10-CM | POA: Diagnosis not present

## 2012-12-28 DIAGNOSIS — M2040 Other hammer toe(s) (acquired), unspecified foot: Secondary | ICD-10-CM | POA: Diagnosis not present

## 2013-01-02 DIAGNOSIS — R109 Unspecified abdominal pain: Secondary | ICD-10-CM | POA: Diagnosis not present

## 2013-01-11 DIAGNOSIS — Z79899 Other long term (current) drug therapy: Secondary | ICD-10-CM | POA: Diagnosis not present

## 2013-01-11 DIAGNOSIS — E559 Vitamin D deficiency, unspecified: Secondary | ICD-10-CM | POA: Diagnosis not present

## 2013-01-11 DIAGNOSIS — E669 Obesity, unspecified: Secondary | ICD-10-CM | POA: Diagnosis not present

## 2013-01-11 DIAGNOSIS — E039 Hypothyroidism, unspecified: Secondary | ICD-10-CM | POA: Diagnosis not present

## 2013-02-13 DIAGNOSIS — F4323 Adjustment disorder with mixed anxiety and depressed mood: Secondary | ICD-10-CM | POA: Diagnosis not present

## 2013-04-23 DIAGNOSIS — H5210 Myopia, unspecified eye: Secondary | ICD-10-CM | POA: Diagnosis not present

## 2013-04-23 DIAGNOSIS — H25019 Cortical age-related cataract, unspecified eye: Secondary | ICD-10-CM | POA: Diagnosis not present

## 2013-04-23 DIAGNOSIS — H52229 Regular astigmatism, unspecified eye: Secondary | ICD-10-CM | POA: Diagnosis not present

## 2013-04-23 DIAGNOSIS — H35379 Puckering of macula, unspecified eye: Secondary | ICD-10-CM | POA: Diagnosis not present

## 2013-04-30 DIAGNOSIS — Z1231 Encounter for screening mammogram for malignant neoplasm of breast: Secondary | ICD-10-CM | POA: Diagnosis not present

## 2013-05-07 DIAGNOSIS — Z79899 Other long term (current) drug therapy: Secondary | ICD-10-CM | POA: Diagnosis not present

## 2013-05-07 DIAGNOSIS — E669 Obesity, unspecified: Secondary | ICD-10-CM | POA: Diagnosis not present

## 2013-05-07 DIAGNOSIS — E039 Hypothyroidism, unspecified: Secondary | ICD-10-CM | POA: Diagnosis not present

## 2013-05-07 DIAGNOSIS — E559 Vitamin D deficiency, unspecified: Secondary | ICD-10-CM | POA: Diagnosis not present

## 2013-05-10 DIAGNOSIS — R32 Unspecified urinary incontinence: Secondary | ICD-10-CM | POA: Diagnosis not present

## 2013-05-15 DIAGNOSIS — F4323 Adjustment disorder with mixed anxiety and depressed mood: Secondary | ICD-10-CM | POA: Diagnosis not present

## 2013-06-16 DIAGNOSIS — Z23 Encounter for immunization: Secondary | ICD-10-CM | POA: Diagnosis not present

## 2013-07-10 DIAGNOSIS — R350 Frequency of micturition: Secondary | ICD-10-CM | POA: Diagnosis not present

## 2013-07-10 DIAGNOSIS — R109 Unspecified abdominal pain: Secondary | ICD-10-CM | POA: Diagnosis not present

## 2013-07-11 ENCOUNTER — Encounter (HOSPITAL_COMMUNITY): Payer: Self-pay | Admitting: Emergency Medicine

## 2013-07-11 ENCOUNTER — Emergency Department (HOSPITAL_COMMUNITY): Payer: Medicare Other

## 2013-07-11 ENCOUNTER — Emergency Department (HOSPITAL_COMMUNITY)
Admission: EM | Admit: 2013-07-11 | Discharge: 2013-07-11 | Disposition: A | Discharge status: Home / Self Care | Payer: Medicare Other | Attending: Emergency Medicine | Admitting: Emergency Medicine

## 2013-07-11 DIAGNOSIS — F319 Bipolar disorder, unspecified: Secondary | ICD-10-CM | POA: Insufficient documentation

## 2013-07-11 DIAGNOSIS — I2510 Atherosclerotic heart disease of native coronary artery without angina pectoris: Secondary | ICD-10-CM | POA: Insufficient documentation

## 2013-07-11 DIAGNOSIS — R109 Unspecified abdominal pain: Secondary | ICD-10-CM | POA: Diagnosis not present

## 2013-07-11 DIAGNOSIS — I10 Essential (primary) hypertension: Secondary | ICD-10-CM | POA: Diagnosis not present

## 2013-07-11 DIAGNOSIS — Z87440 Personal history of urinary (tract) infections: Secondary | ICD-10-CM | POA: Insufficient documentation

## 2013-07-11 DIAGNOSIS — Z79899 Other long term (current) drug therapy: Secondary | ICD-10-CM | POA: Diagnosis not present

## 2013-07-11 DIAGNOSIS — F411 Generalized anxiety disorder: Secondary | ICD-10-CM | POA: Insufficient documentation

## 2013-07-11 DIAGNOSIS — K461 Unspecified abdominal hernia with gangrene: Secondary | ICD-10-CM | POA: Diagnosis not present

## 2013-07-11 DIAGNOSIS — N12 Tubulo-interstitial nephritis, not specified as acute or chronic: Secondary | ICD-10-CM | POA: Diagnosis not present

## 2013-07-11 DIAGNOSIS — M7918 Myalgia, other site: Secondary | ICD-10-CM

## 2013-07-11 LAB — URINALYSIS, ROUTINE W REFLEX MICROSCOPIC
Glucose, UA: NEGATIVE mg/dL
Leukocytes, UA: NEGATIVE
Protein, ur: NEGATIVE mg/dL
Urobilinogen, UA: 0.2 mg/dL (ref 0.0–1.0)

## 2013-07-11 LAB — CBC WITH DIFFERENTIAL/PLATELET
Basophils Absolute: 0 10*3/uL (ref 0.0–0.1)
Eosinophils Relative: 1 % (ref 0–5)
Hemoglobin: 15 g/dL (ref 12.0–15.0)
Lymphocytes Relative: 22 % (ref 12–46)
MCV: 78.5 fL (ref 78.0–100.0)
Neutro Abs: 5.7 10*3/uL (ref 1.7–7.7)
Neutrophils Relative %: 72 % (ref 43–77)
Platelets: 256 10*3/uL (ref 150–400)
RBC: 5.31 MIL/uL — ABNORMAL HIGH (ref 3.87–5.11)
RDW: 14.6 % (ref 11.5–15.5)

## 2013-07-11 LAB — BASIC METABOLIC PANEL
CO2: 22 mEq/L (ref 19–32)
Calcium: 10.2 mg/dL (ref 8.4–10.5)
Sodium: 137 mEq/L (ref 135–145)

## 2013-07-11 MED ORDER — HYDROCODONE-ACETAMINOPHEN 5-325 MG PO TABS: 1.0000 | ORAL_TABLET | ORAL | Status: DC | PRN

## 2013-07-11 MED ORDER — MORPHINE SULFATE 4 MG/ML IJ SOLN
4.0000 mg | Freq: Once | INTRAMUSCULAR | Status: AC
  Administered 2013-07-11: 4 mg via INTRAVENOUS
  Filled 2013-07-11: qty 1

## 2013-07-11 MED ORDER — HYDROMORPHONE HCL PF 1 MG/ML IJ SOLN
0.5000 mg | Freq: Once | INTRAMUSCULAR | Status: AC
  Administered 2013-07-11: 0.5 mg via INTRAVENOUS
  Filled 2013-07-11: qty 1

## 2013-07-11 MED ORDER — OXYCODONE-ACETAMINOPHEN 5-325 MG PO TABS
1.0000 | ORAL_TABLET | ORAL | Status: DC | PRN
Start: 2013-07-11 — End: 2014-08-02

## 2013-07-11 MED ORDER — CYCLOBENZAPRINE HCL 10 MG PO TABS: 5.0000 mg | ORAL_TABLET | Freq: Two times a day (BID) | ORAL | Status: DC | PRN

## 2013-07-11 MED ORDER — SODIUM CHLORIDE 0.9 % IV SOLN
Freq: Once | INTRAVENOUS | Status: AC
  Administered 2013-07-11: 17:00:00 via INTRAVENOUS

## 2013-07-11 NOTE — ED Notes (Signed)
From urgent care with 10/10 Left flank pain. PT states NO urinary Sx. Urinalysis done at PCP on Monday. ABX prescribed, PT taken for one day and this am.

## 2013-07-11 NOTE — ED Notes (Signed)
I gave the patient a warm blanket. 

## 2013-07-11 NOTE — ED Notes (Signed)
Per pt being treated for UTI. sts left flank pain. Pt crying and can barely talk do to the pain.

## 2013-07-11 NOTE — ED Provider Notes (Signed)
CSN: [id number]     Arrival date & time 07/11/13  1412 History   First MD Initiated Contact with Patient 07/11/13 1552     Chief Complaint  Patient presents with  . Flank Pain   (Consider location/radiation/quality/duration/timing/severity/associated sxs/prior Treatment) Patient is a 62 y.o. female presenting with flank pain. The history is provided by the patient. No language interpreter was used.  Flank Pain This is a new problem. The current episode started today. Associated symptoms include abdominal pain. Pertinent negatives include no chest pain, chills, fever, nausea or vomiting. Associated symptoms comments: Left flank pain radiating into LLQ abdomen starting this morning. She reports history of UTI but never with flank symptoms. No fever, N, V. She has been using pyridium without relief. .    Past Medical History  Diagnosis Date  . Hypertension   . Coronary artery disease   . Depressed   . Anxiety   . Bipolar 1 disorder    History reviewed. No pertinent past surgical history. History reviewed. No pertinent family history. History  Substance Use Topics  . Smoking status: Never Smoker   . Smokeless tobacco: Not on file  . Alcohol Use: No   OB History   Grav Para Term Preterm Abortions TAB SAB Ect Mult Living                 Review of Systems  Constitutional: Negative for fever and chills.  Respiratory: Negative.  Negative for shortness of breath.   Cardiovascular: Negative.  Negative for chest pain.  Gastrointestinal: Positive for abdominal pain. Negative for nausea and vomiting.  Genitourinary: Positive for flank pain. Negative for dysuria.  Musculoskeletal: Negative.   Skin: Negative.   Neurological: Negative.     Allergies  Review of patient's allergies indicates no known allergies.  Home Medications   Current Outpatient Rx  Name  Route  Sig  Dispense  Refill  . clonazePAM (KLONOPIN) 0.5 MG tablet   Oral   Take 0.5 mg by mouth 2 (two) times daily as  needed. For aneixty          . estrogens conjugated, synthetic A, (CENESTIN) 0.625 MG tablet   Oral   Take 0.625 mg by mouth daily.           . naproxen sodium (ANAPROX) 220 MG tablet   Oral   Take 660 mg by mouth 2 (two) times daily as needed (for pain).         Marland Kitchen olmesartan-hydrochlorothiazide (BENICAR HCT) 40-12.5 MG per tablet   Oral   Take 1 tablet by mouth daily.           . phenazopyridine (PYRIDIUM) 200 MG tablet   Oral   Take 200 mg by mouth 3 (three) times daily as needed for pain.         Marland Kitchen sertraline (ZOLOFT) 100 MG tablet   Oral   Take 200 mg by mouth daily after breakfast.           . sulfamethoxazole-trimethoprim (BACTRIM DS) 800-160 MG per tablet   Oral   Take 1 tablet by mouth 2 (two) times daily.          BP 125/81  Pulse 60  Temp(Src) 98.1 F (36.7 C) (Oral)  Resp 18  SpO2 100% Physical Exam  Constitutional: She is oriented to person, place, and time. She appears well-developed and well-nourished.  HENT:  Head: Normocephalic.  Neck: Normal range of motion. Neck supple.  Cardiovascular: Normal rate and regular rhythm.  Pulmonary/Chest: Effort normal and breath sounds normal.  Abdominal: Soft. Bowel sounds are normal. There is tenderness. There is no rebound and no guarding.  Left sided abdominal tenderness. No guarding, mass or rebound.  Genitourinary:  Left flank tender to palpation.  Musculoskeletal: Normal range of motion.  Neurological: She is alert and oriented to person, place, and time.  Skin: Skin is warm and dry. No rash noted.  Psychiatric: She has a normal mood and affect.    ED Course  Procedures (including critical care time) Labs Review Labs Reviewed  URINE CULTURE  CBC WITH DIFFERENTIAL  BASIC METABOLIC PANEL  URINALYSIS, ROUTINE W REFLEX MICROSCOPIC   Results for orders placed during the hospital encounter of 07/11/13  CBC WITH DIFFERENTIAL      Result Value Range   WBC 7.9  4.0 - 10.5 K/uL   RBC 5.31 (*)  3.87 - 5.11 MIL/uL   Hemoglobin 15.0  12.0 - 15.0 g/dL   HCT 16.1  09.6 - 04.5 %   MCV 78.5  78.0 - 100.0 fL   MCH 28.2  26.0 - 34.0 pg   MCHC 36.0  30.0 - 36.0 g/dL   RDW 40.9  81.1 - 91.4 %   Platelets 256  150 - 400 K/uL   Neutrophils Relative % 72  43 - 77 %   Neutro Abs 5.7  1.7 - 7.7 K/uL   Lymphocytes Relative 22  12 - 46 %   Lymphs Abs 1.7  0.7 - 4.0 K/uL   Monocytes Relative 5  3 - 12 %   Monocytes Absolute 0.4  0.1 - 1.0 K/uL   Eosinophils Relative 1  0 - 5 %   Eosinophils Absolute 0.0  0.0 - 0.7 K/uL   Basophils Relative 0  0 - 1 %   Basophils Absolute 0.0  0.0 - 0.1 K/uL  BASIC METABOLIC PANEL      Result Value Range   Sodium 137  135 - 145 mEq/L   Potassium 3.3 (*) 3.5 - 5.1 mEq/L   Chloride 99  96 - 112 mEq/L   CO2 22  19 - 32 mEq/L   Glucose, Bld 71  70 - 99 mg/dL   BUN 14  6 - 23 mg/dL   Creatinine, Ser 7.82  0.50 - 1.10 mg/dL   Calcium 95.6  8.4 - 21.3 mg/dL   GFR calc non Af Amer 58 (*) >90 mL/min   GFR calc Af Amer 67 (*) >90 mL/min  URINALYSIS, ROUTINE W REFLEX MICROSCOPIC      Result Value Range   Color, Urine YELLOW  YELLOW   APPearance CLEAR  CLEAR   Specific Gravity, Urine 1.011  1.005 - 1.030   pH 8.5 (*) 5.0 - 8.0   Glucose, UA NEGATIVE  NEGATIVE mg/dL   Hgb urine dipstick NEGATIVE  NEGATIVE   Bilirubin Urine NEGATIVE  NEGATIVE   Ketones, ur 15 (*) NEGATIVE mg/dL   Protein, ur NEGATIVE  NEGATIVE mg/dL   Urobilinogen, UA 0.2  0.0 - 1.0 mg/dL   Nitrite NEGATIVE  NEGATIVE   Leukocytes, UA NEGATIVE  NEGATIVE   Ct Abdomen Pelvis Wo Contrast  07/11/2013   CLINICAL DATA:  Severe left flank pain, being treated for UTI, antibiotics for 1 day, history hypertension, coronary artery disease  EXAM: CT ABDOMEN AND PELVIS WITHOUT CONTRAST  TECHNIQUE: Multidetector CT imaging of the abdomen and pelvis was performed following the standard protocol without intravenous contrast. Sagittal and coronal MPR images reconstructed from axial data set.  GI contrast was  not administered.  COMPARISON:  11/19/2009  FINDINGS: Lung bases clear.  Minimal pericardial effusion.  Scattered atherosclerotic calcification aorta.  Umbilical hernia containing fat.  No definite urinary tract calcification, hydronephrosis or ureteral dilatation.  Within limits of a nonenhanced exam no focal abnormalities of the liver, spleen, pancreas, kidneys, or adrenal glands.  Normal appendix, bladder, and ureters with absence of uterus and nonvisualization of ovaries.  Stomach and bowel loops normal appearance for technique.  No mass, adenopathy, free fluid or inflammatory process.  Bones unremarkable.  IMPRESSION: Umbilical hernia containing fat.  No definite acute intra-abdominal or intrapelvic abnormalities.  Minimal pericardial effusion.   Electronically Signed   By: Ulyses Southward M.D.   On: 07/11/2013 18:41   Imaging Review No results found.  EKG Interpretation   None       MDM  No diagnosis found. 1. Musculoskeletal pain  All lab studies and CT are negative for acute findings. She remains tender to touch and pain worse with movement. Dr. Effie Shy has evaluated the patient. VSS. Suspect musculoskeletal origin of pain. Stable for discharge.     Arnoldo Hooker, PA-C 07/11/13 1943

## 2013-07-11 NOTE — ED Provider Notes (Signed)
  Face-to-face evaluation   History: Onset low back pain, bilateral several days ago. She saw her oncologist yesterday and was put on Septra for UTI. She states the pain is worsened. She denies fever, chills, nausea or vomiting. No prior episodes of back pain.  Physical exam: Alert, calm, cooperative. Normal range of motion lumbar spine.  Nursing Notes Reviewed/ Care Coordinated, and agree without changes. Applicable Imaging Reviewed. Radiologic imaging report reviewed and images by CT  - viewed, by me. Interpretation of Laboratory Data incorporated into ED treatment  Medical screening examination/treatment/procedure(s) were conducted as a shared visit with non-physician practitioner(s) and myself.  I personally evaluated the patient during the encounter  Flint Melter, MD 07/11/13 2325

## 2013-07-12 LAB — URINE CULTURE

## 2013-08-02 DIAGNOSIS — Z79899 Other long term (current) drug therapy: Secondary | ICD-10-CM | POA: Diagnosis not present

## 2013-08-02 DIAGNOSIS — E039 Hypothyroidism, unspecified: Secondary | ICD-10-CM | POA: Diagnosis not present

## 2013-08-02 DIAGNOSIS — E669 Obesity, unspecified: Secondary | ICD-10-CM | POA: Diagnosis not present

## 2013-08-02 DIAGNOSIS — E559 Vitamin D deficiency, unspecified: Secondary | ICD-10-CM | POA: Diagnosis not present

## 2013-08-30 DIAGNOSIS — F4323 Adjustment disorder with mixed anxiety and depressed mood: Secondary | ICD-10-CM | POA: Diagnosis not present

## 2013-09-04 DIAGNOSIS — M545 Low back pain: Secondary | ICD-10-CM | POA: Diagnosis not present

## 2013-09-04 DIAGNOSIS — R3989 Other symptoms and signs involving the genitourinary system: Secondary | ICD-10-CM | POA: Diagnosis not present

## 2013-09-27 HISTORY — PX: OTHER SURGICAL HISTORY: SHX169

## 2013-10-11 DIAGNOSIS — E039 Hypothyroidism, unspecified: Secondary | ICD-10-CM | POA: Diagnosis not present

## 2013-10-11 DIAGNOSIS — E559 Vitamin D deficiency, unspecified: Secondary | ICD-10-CM | POA: Diagnosis not present

## 2013-10-11 DIAGNOSIS — E669 Obesity, unspecified: Secondary | ICD-10-CM | POA: Diagnosis not present

## 2013-10-11 DIAGNOSIS — Z79899 Other long term (current) drug therapy: Secondary | ICD-10-CM | POA: Diagnosis not present

## 2013-11-20 DIAGNOSIS — M25519 Pain in unspecified shoulder: Secondary | ICD-10-CM | POA: Diagnosis not present

## 2013-12-13 DIAGNOSIS — M25519 Pain in unspecified shoulder: Secondary | ICD-10-CM | POA: Diagnosis not present

## 2013-12-13 DIAGNOSIS — E559 Vitamin D deficiency, unspecified: Secondary | ICD-10-CM | POA: Diagnosis not present

## 2013-12-13 DIAGNOSIS — E669 Obesity, unspecified: Secondary | ICD-10-CM | POA: Diagnosis not present

## 2013-12-13 DIAGNOSIS — Z79899 Other long term (current) drug therapy: Secondary | ICD-10-CM | POA: Diagnosis not present

## 2013-12-18 DIAGNOSIS — M25519 Pain in unspecified shoulder: Secondary | ICD-10-CM | POA: Diagnosis not present

## 2013-12-22 DIAGNOSIS — M19019 Primary osteoarthritis, unspecified shoulder: Secondary | ICD-10-CM | POA: Diagnosis not present

## 2013-12-25 DIAGNOSIS — M19019 Primary osteoarthritis, unspecified shoulder: Secondary | ICD-10-CM | POA: Diagnosis not present

## 2013-12-31 DIAGNOSIS — M75120 Complete rotator cuff tear or rupture of unspecified shoulder, not specified as traumatic: Secondary | ICD-10-CM | POA: Diagnosis not present

## 2013-12-31 DIAGNOSIS — G8918 Other acute postprocedural pain: Secondary | ICD-10-CM | POA: Diagnosis not present

## 2013-12-31 DIAGNOSIS — M24119 Other articular cartilage disorders, unspecified shoulder: Secondary | ICD-10-CM | POA: Diagnosis not present

## 2013-12-31 DIAGNOSIS — M67919 Unspecified disorder of synovium and tendon, unspecified shoulder: Secondary | ICD-10-CM | POA: Diagnosis not present

## 2013-12-31 DIAGNOSIS — M24019 Loose body in unspecified shoulder: Secondary | ICD-10-CM | POA: Diagnosis not present

## 2013-12-31 DIAGNOSIS — M19019 Primary osteoarthritis, unspecified shoulder: Secondary | ICD-10-CM | POA: Diagnosis not present

## 2013-12-31 DIAGNOSIS — M25819 Other specified joint disorders, unspecified shoulder: Secondary | ICD-10-CM | POA: Diagnosis not present

## 2014-01-08 DIAGNOSIS — Z4789 Encounter for other orthopedic aftercare: Secondary | ICD-10-CM | POA: Diagnosis not present

## 2014-01-15 DIAGNOSIS — M6281 Muscle weakness (generalized): Secondary | ICD-10-CM | POA: Diagnosis not present

## 2014-01-15 DIAGNOSIS — M19019 Primary osteoarthritis, unspecified shoulder: Secondary | ICD-10-CM | POA: Diagnosis not present

## 2014-01-15 DIAGNOSIS — M25619 Stiffness of unspecified shoulder, not elsewhere classified: Secondary | ICD-10-CM | POA: Diagnosis not present

## 2014-01-15 DIAGNOSIS — Z4789 Encounter for other orthopedic aftercare: Secondary | ICD-10-CM | POA: Diagnosis not present

## 2014-01-15 DIAGNOSIS — M24019 Loose body in unspecified shoulder: Secondary | ICD-10-CM | POA: Diagnosis not present

## 2014-01-15 DIAGNOSIS — M25519 Pain in unspecified shoulder: Secondary | ICD-10-CM | POA: Diagnosis not present

## 2014-01-17 DIAGNOSIS — M19019 Primary osteoarthritis, unspecified shoulder: Secondary | ICD-10-CM | POA: Diagnosis not present

## 2014-01-17 DIAGNOSIS — M6281 Muscle weakness (generalized): Secondary | ICD-10-CM | POA: Diagnosis not present

## 2014-01-17 DIAGNOSIS — M24019 Loose body in unspecified shoulder: Secondary | ICD-10-CM | POA: Diagnosis not present

## 2014-01-17 DIAGNOSIS — M25619 Stiffness of unspecified shoulder, not elsewhere classified: Secondary | ICD-10-CM | POA: Diagnosis not present

## 2014-01-17 DIAGNOSIS — M25519 Pain in unspecified shoulder: Secondary | ICD-10-CM | POA: Diagnosis not present

## 2014-01-17 DIAGNOSIS — Z4789 Encounter for other orthopedic aftercare: Secondary | ICD-10-CM | POA: Diagnosis not present

## 2014-01-22 DIAGNOSIS — M2550 Pain in unspecified joint: Secondary | ICD-10-CM | POA: Diagnosis not present

## 2014-01-22 DIAGNOSIS — E039 Hypothyroidism, unspecified: Secondary | ICD-10-CM | POA: Diagnosis not present

## 2014-01-22 DIAGNOSIS — Z79899 Other long term (current) drug therapy: Secondary | ICD-10-CM | POA: Diagnosis not present

## 2014-01-22 DIAGNOSIS — E538 Deficiency of other specified B group vitamins: Secondary | ICD-10-CM | POA: Diagnosis not present

## 2014-01-23 DIAGNOSIS — Z4789 Encounter for other orthopedic aftercare: Secondary | ICD-10-CM | POA: Diagnosis not present

## 2014-01-23 DIAGNOSIS — M24019 Loose body in unspecified shoulder: Secondary | ICD-10-CM | POA: Diagnosis not present

## 2014-01-23 DIAGNOSIS — M19019 Primary osteoarthritis, unspecified shoulder: Secondary | ICD-10-CM | POA: Diagnosis not present

## 2014-01-25 DIAGNOSIS — Z4789 Encounter for other orthopedic aftercare: Secondary | ICD-10-CM | POA: Diagnosis not present

## 2014-01-25 DIAGNOSIS — M24019 Loose body in unspecified shoulder: Secondary | ICD-10-CM | POA: Diagnosis not present

## 2014-01-25 DIAGNOSIS — M19019 Primary osteoarthritis, unspecified shoulder: Secondary | ICD-10-CM | POA: Diagnosis not present

## 2014-01-30 DIAGNOSIS — F4323 Adjustment disorder with mixed anxiety and depressed mood: Secondary | ICD-10-CM | POA: Diagnosis not present

## 2014-01-31 DIAGNOSIS — M24019 Loose body in unspecified shoulder: Secondary | ICD-10-CM | POA: Diagnosis not present

## 2014-01-31 DIAGNOSIS — Z4789 Encounter for other orthopedic aftercare: Secondary | ICD-10-CM | POA: Diagnosis not present

## 2014-01-31 DIAGNOSIS — M19019 Primary osteoarthritis, unspecified shoulder: Secondary | ICD-10-CM | POA: Diagnosis not present

## 2014-02-01 DIAGNOSIS — M24019 Loose body in unspecified shoulder: Secondary | ICD-10-CM | POA: Diagnosis not present

## 2014-02-01 DIAGNOSIS — M19019 Primary osteoarthritis, unspecified shoulder: Secondary | ICD-10-CM | POA: Diagnosis not present

## 2014-02-05 DIAGNOSIS — M25519 Pain in unspecified shoulder: Secondary | ICD-10-CM | POA: Diagnosis not present

## 2014-02-05 DIAGNOSIS — M6281 Muscle weakness (generalized): Secondary | ICD-10-CM | POA: Diagnosis not present

## 2014-02-05 DIAGNOSIS — M25619 Stiffness of unspecified shoulder, not elsewhere classified: Secondary | ICD-10-CM | POA: Diagnosis not present

## 2014-02-07 DIAGNOSIS — M24019 Loose body in unspecified shoulder: Secondary | ICD-10-CM | POA: Diagnosis not present

## 2014-02-07 DIAGNOSIS — Z4789 Encounter for other orthopedic aftercare: Secondary | ICD-10-CM | POA: Diagnosis not present

## 2014-02-07 DIAGNOSIS — M19019 Primary osteoarthritis, unspecified shoulder: Secondary | ICD-10-CM | POA: Diagnosis not present

## 2014-02-11 DIAGNOSIS — M25619 Stiffness of unspecified shoulder, not elsewhere classified: Secondary | ICD-10-CM | POA: Diagnosis not present

## 2014-02-11 DIAGNOSIS — M6281 Muscle weakness (generalized): Secondary | ICD-10-CM | POA: Diagnosis not present

## 2014-02-11 DIAGNOSIS — M25519 Pain in unspecified shoulder: Secondary | ICD-10-CM | POA: Diagnosis not present

## 2014-02-14 DIAGNOSIS — M25519 Pain in unspecified shoulder: Secondary | ICD-10-CM | POA: Diagnosis not present

## 2014-02-14 DIAGNOSIS — M25619 Stiffness of unspecified shoulder, not elsewhere classified: Secondary | ICD-10-CM | POA: Diagnosis not present

## 2014-02-14 DIAGNOSIS — M6281 Muscle weakness (generalized): Secondary | ICD-10-CM | POA: Diagnosis not present

## 2014-02-15 DIAGNOSIS — M19019 Primary osteoarthritis, unspecified shoulder: Secondary | ICD-10-CM | POA: Diagnosis not present

## 2014-02-15 DIAGNOSIS — M24019 Loose body in unspecified shoulder: Secondary | ICD-10-CM | POA: Diagnosis not present

## 2014-02-15 DIAGNOSIS — Z4789 Encounter for other orthopedic aftercare: Secondary | ICD-10-CM | POA: Diagnosis not present

## 2014-02-20 DIAGNOSIS — M25619 Stiffness of unspecified shoulder, not elsewhere classified: Secondary | ICD-10-CM | POA: Diagnosis not present

## 2014-02-20 DIAGNOSIS — M25519 Pain in unspecified shoulder: Secondary | ICD-10-CM | POA: Diagnosis not present

## 2014-02-20 DIAGNOSIS — M6281 Muscle weakness (generalized): Secondary | ICD-10-CM | POA: Diagnosis not present

## 2014-02-21 DIAGNOSIS — E039 Hypothyroidism, unspecified: Secondary | ICD-10-CM | POA: Diagnosis not present

## 2014-02-21 DIAGNOSIS — M25519 Pain in unspecified shoulder: Secondary | ICD-10-CM | POA: Diagnosis not present

## 2014-02-21 DIAGNOSIS — Z79899 Other long term (current) drug therapy: Secondary | ICD-10-CM | POA: Diagnosis not present

## 2014-02-21 DIAGNOSIS — E669 Obesity, unspecified: Secondary | ICD-10-CM | POA: Diagnosis not present

## 2014-02-26 DIAGNOSIS — M25619 Stiffness of unspecified shoulder, not elsewhere classified: Secondary | ICD-10-CM | POA: Diagnosis not present

## 2014-02-26 DIAGNOSIS — M25519 Pain in unspecified shoulder: Secondary | ICD-10-CM | POA: Diagnosis not present

## 2014-02-26 DIAGNOSIS — M6281 Muscle weakness (generalized): Secondary | ICD-10-CM | POA: Diagnosis not present

## 2014-02-28 DIAGNOSIS — M25619 Stiffness of unspecified shoulder, not elsewhere classified: Secondary | ICD-10-CM | POA: Diagnosis not present

## 2014-02-28 DIAGNOSIS — M6281 Muscle weakness (generalized): Secondary | ICD-10-CM | POA: Diagnosis not present

## 2014-02-28 DIAGNOSIS — M25519 Pain in unspecified shoulder: Secondary | ICD-10-CM | POA: Diagnosis not present

## 2014-03-05 DIAGNOSIS — M6281 Muscle weakness (generalized): Secondary | ICD-10-CM | POA: Diagnosis not present

## 2014-03-05 DIAGNOSIS — M25619 Stiffness of unspecified shoulder, not elsewhere classified: Secondary | ICD-10-CM | POA: Diagnosis not present

## 2014-03-05 DIAGNOSIS — M25519 Pain in unspecified shoulder: Secondary | ICD-10-CM | POA: Diagnosis not present

## 2014-03-07 DIAGNOSIS — M25519 Pain in unspecified shoulder: Secondary | ICD-10-CM | POA: Diagnosis not present

## 2014-03-07 DIAGNOSIS — M25619 Stiffness of unspecified shoulder, not elsewhere classified: Secondary | ICD-10-CM | POA: Diagnosis not present

## 2014-03-07 DIAGNOSIS — M6281 Muscle weakness (generalized): Secondary | ICD-10-CM | POA: Diagnosis not present

## 2014-03-12 DIAGNOSIS — M25619 Stiffness of unspecified shoulder, not elsewhere classified: Secondary | ICD-10-CM | POA: Diagnosis not present

## 2014-03-12 DIAGNOSIS — M25519 Pain in unspecified shoulder: Secondary | ICD-10-CM | POA: Diagnosis not present

## 2014-03-12 DIAGNOSIS — M6281 Muscle weakness (generalized): Secondary | ICD-10-CM | POA: Diagnosis not present

## 2014-03-14 DIAGNOSIS — M25519 Pain in unspecified shoulder: Secondary | ICD-10-CM | POA: Diagnosis not present

## 2014-03-14 DIAGNOSIS — M6281 Muscle weakness (generalized): Secondary | ICD-10-CM | POA: Diagnosis not present

## 2014-03-14 DIAGNOSIS — M25619 Stiffness of unspecified shoulder, not elsewhere classified: Secondary | ICD-10-CM | POA: Diagnosis not present

## 2014-03-19 DIAGNOSIS — M5030 Other cervical disc degeneration, unspecified cervical region: Secondary | ICD-10-CM | POA: Diagnosis not present

## 2014-04-02 DIAGNOSIS — M2550 Pain in unspecified joint: Secondary | ICD-10-CM | POA: Diagnosis not present

## 2014-04-02 DIAGNOSIS — G43909 Migraine, unspecified, not intractable, without status migrainosus: Secondary | ICD-10-CM | POA: Diagnosis not present

## 2014-04-02 DIAGNOSIS — F319 Bipolar disorder, unspecified: Secondary | ICD-10-CM | POA: Diagnosis not present

## 2014-04-02 DIAGNOSIS — E669 Obesity, unspecified: Secondary | ICD-10-CM | POA: Diagnosis not present

## 2014-05-02 DIAGNOSIS — Z1231 Encounter for screening mammogram for malignant neoplasm of breast: Secondary | ICD-10-CM | POA: Diagnosis not present

## 2014-05-07 DIAGNOSIS — F4323 Adjustment disorder with mixed anxiety and depressed mood: Secondary | ICD-10-CM | POA: Diagnosis not present

## 2014-05-11 DIAGNOSIS — M79609 Pain in unspecified limb: Secondary | ICD-10-CM | POA: Diagnosis not present

## 2014-05-21 DIAGNOSIS — Z79899 Other long term (current) drug therapy: Secondary | ICD-10-CM | POA: Diagnosis not present

## 2014-05-21 DIAGNOSIS — Z8249 Family history of ischemic heart disease and other diseases of the circulatory system: Secondary | ICD-10-CM | POA: Diagnosis not present

## 2014-05-21 DIAGNOSIS — I119 Hypertensive heart disease without heart failure: Secondary | ICD-10-CM | POA: Diagnosis not present

## 2014-05-21 DIAGNOSIS — M1990 Unspecified osteoarthritis, unspecified site: Secondary | ICD-10-CM | POA: Diagnosis not present

## 2014-05-21 DIAGNOSIS — M2550 Pain in unspecified joint: Secondary | ICD-10-CM | POA: Diagnosis not present

## 2014-05-21 DIAGNOSIS — E039 Hypothyroidism, unspecified: Secondary | ICD-10-CM | POA: Diagnosis not present

## 2014-07-23 DIAGNOSIS — E669 Obesity, unspecified: Secondary | ICD-10-CM | POA: Diagnosis not present

## 2014-07-23 DIAGNOSIS — R312 Other microscopic hematuria: Secondary | ICD-10-CM | POA: Diagnosis not present

## 2014-07-23 DIAGNOSIS — R1032 Left lower quadrant pain: Secondary | ICD-10-CM | POA: Diagnosis not present

## 2014-07-23 DIAGNOSIS — M2550 Pain in unspecified joint: Secondary | ICD-10-CM | POA: Diagnosis not present

## 2014-07-31 DIAGNOSIS — R1084 Generalized abdominal pain: Secondary | ICD-10-CM | POA: Diagnosis not present

## 2014-07-31 DIAGNOSIS — R1032 Left lower quadrant pain: Secondary | ICD-10-CM | POA: Diagnosis not present

## 2014-08-02 ENCOUNTER — Emergency Department (HOSPITAL_COMMUNITY): Payer: Medicare Other

## 2014-08-02 ENCOUNTER — Emergency Department (HOSPITAL_COMMUNITY)
Admission: EM | Admit: 2014-08-02 | Discharge: 2014-08-02 | Disposition: A | Discharge status: Home / Self Care | Payer: Medicare Other | Attending: Emergency Medicine | Admitting: Emergency Medicine

## 2014-08-02 ENCOUNTER — Encounter (HOSPITAL_COMMUNITY): Payer: Self-pay | Admitting: *Deleted

## 2014-08-02 DIAGNOSIS — R319 Hematuria, unspecified: Secondary | ICD-10-CM | POA: Diagnosis not present

## 2014-08-02 DIAGNOSIS — K429 Umbilical hernia without obstruction or gangrene: Secondary | ICD-10-CM | POA: Diagnosis not present

## 2014-08-02 DIAGNOSIS — F419 Anxiety disorder, unspecified: Secondary | ICD-10-CM | POA: Diagnosis not present

## 2014-08-02 DIAGNOSIS — F319 Bipolar disorder, unspecified: Secondary | ICD-10-CM | POA: Diagnosis not present

## 2014-08-02 DIAGNOSIS — R1012 Left upper quadrant pain: Secondary | ICD-10-CM | POA: Diagnosis not present

## 2014-08-02 DIAGNOSIS — R109 Unspecified abdominal pain: Secondary | ICD-10-CM

## 2014-08-02 DIAGNOSIS — I10 Essential (primary) hypertension: Secondary | ICD-10-CM | POA: Insufficient documentation

## 2014-08-02 DIAGNOSIS — I7090 Unspecified atherosclerosis: Secondary | ICD-10-CM | POA: Diagnosis not present

## 2014-08-02 DIAGNOSIS — I2510 Atherosclerotic heart disease of native coronary artery without angina pectoris: Secondary | ICD-10-CM | POA: Diagnosis not present

## 2014-08-02 DIAGNOSIS — Z79818 Long term (current) use of other agents affecting estrogen receptors and estrogen levels: Secondary | ICD-10-CM | POA: Diagnosis not present

## 2014-08-02 DIAGNOSIS — Z79899 Other long term (current) drug therapy: Secondary | ICD-10-CM | POA: Diagnosis not present

## 2014-08-02 DIAGNOSIS — M545 Low back pain: Secondary | ICD-10-CM | POA: Diagnosis not present

## 2014-08-02 DIAGNOSIS — Z792 Long term (current) use of antibiotics: Secondary | ICD-10-CM | POA: Diagnosis not present

## 2014-08-02 LAB — COMPREHENSIVE METABOLIC PANEL
ALT: 14 U/L (ref 0–35)
ANION GAP: 12 (ref 5–15)
AST: 23 U/L (ref 0–37)
Albumin: 3.6 g/dL (ref 3.5–5.2)
Alkaline Phosphatase: 82 U/L (ref 39–117)
BUN: 15 mg/dL (ref 6–23)
CALCIUM: 9.3 mg/dL (ref 8.4–10.5)
CO2: 25 mEq/L (ref 19–32)
CREATININE: 0.86 mg/dL (ref 0.50–1.10)
Chloride: 101 mEq/L (ref 96–112)
GFR calc Af Amer: 80 mL/min — ABNORMAL LOW (ref >90)
GFR calc non Af Amer: 69 mL/min — ABNORMAL LOW (ref >90)
Glucose, Bld: 129 mg/dL — ABNORMAL HIGH (ref 70–99)
Potassium: 4.2 mEq/L (ref 3.7–5.3)
Sodium: 138 mEq/L (ref 137–147)
TOTAL PROTEIN: 6.6 g/dL (ref 6.0–8.3)
Total Bilirubin: <0.2 mg/dL — ABNORMAL LOW (ref 0.3–1.2)

## 2014-08-02 LAB — CBC WITH DIFFERENTIAL/PLATELET
BASOS PCT: 0 % (ref 0–1)
Basophils Absolute: 0 10*3/uL (ref 0.0–0.1)
EOS ABS: 0.3 10*3/uL (ref 0.0–0.7)
Eosinophils Relative: 5 % (ref 0–5)
HEMATOCRIT: 41.6 % (ref 36.0–46.0)
Hemoglobin: 13.5 g/dL (ref 12.0–15.0)
Lymphocytes Relative: 28 % (ref 12–46)
Lymphs Abs: 1.7 10*3/uL (ref 0.7–4.0)
MCH: 26.9 pg (ref 26.0–34.0)
MCHC: 32.5 g/dL (ref 30.0–36.0)
MCV: 82.9 fL (ref 78.0–100.0)
MONO ABS: 0.5 10*3/uL (ref 0.1–1.0)
MONOS PCT: 8 % (ref 3–12)
NEUTROS PCT: 59 % (ref 43–77)
Neutro Abs: 3.7 10*3/uL (ref 1.7–7.7)
Platelets: 246 10*3/uL (ref 150–400)
RBC: 5.02 MIL/uL (ref 3.87–5.11)
RDW: 15.6 % — ABNORMAL HIGH (ref 11.5–15.5)
WBC: 6.2 10*3/uL (ref 4.0–10.5)

## 2014-08-02 LAB — URINE MICROSCOPIC-ADD ON

## 2014-08-02 LAB — URINALYSIS, ROUTINE W REFLEX MICROSCOPIC
Bilirubin Urine: NEGATIVE
Glucose, UA: NEGATIVE mg/dL
Ketones, ur: NEGATIVE mg/dL
LEUKOCYTES UA: NEGATIVE
NITRITE: NEGATIVE
PH: 7 (ref 5.0–8.0)
Protein, ur: NEGATIVE mg/dL
Specific Gravity, Urine: 1.022 (ref 1.005–1.030)
Urobilinogen, UA: 0.2 mg/dL (ref 0.0–1.0)

## 2014-08-02 MED ORDER — SODIUM CHLORIDE 0.9 % IV BOLUS (SEPSIS)
1000.0000 mL | Freq: Once | INTRAVENOUS | Status: AC
  Administered 2014-08-02: 1000 mL via INTRAVENOUS

## 2014-08-02 MED ORDER — ONDANSETRON HCL 4 MG/2ML IJ SOLN
4.0000 mg | Freq: Once | INTRAMUSCULAR | Status: AC
  Administered 2014-08-02: 4 mg via INTRAVENOUS
  Filled 2014-08-02: qty 2

## 2014-08-02 MED ORDER — OXYCODONE-ACETAMINOPHEN 5-325 MG PO TABS: 1.0000 | ORAL_TABLET | Freq: Four times a day (QID) | ORAL | Status: DC | PRN

## 2014-08-02 MED ORDER — MORPHINE SULFATE 4 MG/ML IJ SOLN
4.0000 mg | Freq: Once | INTRAMUSCULAR | Status: AC
  Administered 2014-08-02: 4 mg via INTRAVENOUS
  Filled 2014-08-02: qty 1

## 2014-08-02 NOTE — ED Notes (Signed)
Pt called with no response

## 2014-08-02 NOTE — ED Provider Notes (Signed)
CSN: [id number]     Arrival date & time 08/02/14  1511 History   First MD Initiated Contact with Patient 08/02/14 1722     Chief Complaint  Patient presents with  . Abdominal Pain     (Consider location/radiation/quality/duration/timing/severity/associated sxs/prior Treatment) Patient is a 63 y.o. female presenting with abdominal pain. The history is provided by the patient. No language interpreter was used.  Abdominal Pain Pain location:  L flank Pain quality: squeezing   Pain radiates to:  LUQ Pain severity:  Severe Onset quality:  Gradual Duration:  10 days Timing:  Sporadic Progression:  Waxing and waning Chronicity:  New Associated symptoms: no nausea and no vomiting     Past Medical History  Diagnosis Date  . Hypertension   . Coronary artery disease   . Depressed   . Anxiety   . Bipolar 1 disorder    History reviewed. No pertinent past surgical history. History reviewed. No pertinent family history. History  Substance Use Topics  . Smoking status: Never Smoker   . Smokeless tobacco: Not on file  . Alcohol Use: No   OB History    No data available     Review of Systems  Gastrointestinal: Positive for abdominal pain. Negative for nausea and vomiting.  All other systems reviewed and are negative.     Allergies  Review of patient's allergies indicates no known allergies.  Home Medications   Prior to Admission medications   Medication Sig Start Date End Date Taking? Authorizing Provider  clonazePAM (KLONOPIN) 0.5 MG tablet Take 0.5 mg by mouth 2 (two) times daily as needed. For aneixty     Historical Provider, MD  cyclobenzaprine (FLEXERIL) 10 MG tablet Take 0.5 tablets (5 mg total) by mouth 2 (two) times daily as needed for muscle spasms. 07/11/13   Shari A Upstill, PA-C  estrogens conjugated, synthetic A, (CENESTIN) 0.625 MG tablet Take 0.625 mg by mouth daily.      Historical Provider, MD  naproxen sodium (ANAPROX) 220 MG tablet Take 660 mg by mouth 2  (two) times daily as needed (for pain).    Historical Provider, MD  olmesartan-hydrochlorothiazide (BENICAR HCT) 40-12.5 MG per tablet Take 1 tablet by mouth daily.      Historical Provider, MD  oxyCODONE-acetaminophen (PERCOCET/ROXICET) 5-325 MG per tablet Take 1-2 tablets by mouth every 4 (four) hours as needed for pain. 07/11/13   Shari A Upstill, PA-C  phenazopyridine (PYRIDIUM) 200 MG tablet Take 200 mg by mouth 3 (three) times daily as needed for pain.    Historical Provider, MD  sertraline (ZOLOFT) 100 MG tablet Take 200 mg by mouth daily after breakfast.      Historical Provider, MD  sulfamethoxazole-trimethoprim (BACTRIM DS) 800-160 MG per tablet Take 1 tablet by mouth 2 (two) times daily.    Historical Provider, MD   BP 132/93 mmHg  Pulse 85  Temp(Src) 99.2 F (37.3 C) (Oral)  Resp 18  SpO2 96% Physical Exam  Constitutional: She is oriented to person, place, and time. She appears well-developed and well-nourished.  HENT:  Head: Normocephalic.  Eyes: Pupils are equal, round, and reactive to light.  Neck: Neck supple.  Cardiovascular: Normal rate and regular rhythm.   Pulmonary/Chest: Effort normal and breath sounds normal.  Abdominal: Soft.    Musculoskeletal: She exhibits no edema.       Lumbar back: She exhibits tenderness.       Back:  Lymphadenopathy:    She has no cervical adenopathy.  Neurological: She is  alert and oriented to person, place, and time.  Skin: Skin is warm and dry.  Psychiatric: She has a normal mood and affect.  Nursing note and vitals reviewed.   ED Course  Procedures (including critical care time) Labs Review Labs Reviewed  CBC WITH DIFFERENTIAL - Abnormal; Notable for the following:    RDW 15.6 (*)    All other components within normal limits  COMPREHENSIVE METABOLIC PANEL - Abnormal; Notable for the following:    Glucose, Bld 129 (*)    Total Bilirubin <0.2 (*)    GFR calc non Af Amer 69 (*)    GFR calc Af Amer 80 (*)    All other  components within normal limits  URINALYSIS, ROUTINE W REFLEX MICROSCOPIC - Abnormal; Notable for the following:    Hgb urine dipstick SMALL (*)    All other components within normal limits  URINE MICROSCOPIC-ADD ON    Imaging Review No results found.   EKG Interpretation None     Patient discussed with and seen by Dr. Betsey Holiday.  Lab and radiology results reviewed. No nephroureteral stones.  Incidental finding of ground class opacities in lower lobes. Patient is a non-smoker, no respiratory symptoms.  Has just completed course of ceftin.  Patient is scheduled to follow-up with urology on Monday, and with her PCP early next week.  Pulmonary referral provided. MDM   Final diagnoses:  None    Left flank pain.     Norman Herrlich, NP 08/02/14 7530  Orpah Greek, MD 08/03/14 [phone]

## 2014-08-02 NOTE — ED Notes (Signed)
Pt reports recently treated for UTI and blood in her urine. Having left side lower abd pain and "burning." pt reported improvement, finished antibiotic today but return of pain last night. [facility] app[facility] on Monday and no relief with pain meds at home.

## 2014-08-02 NOTE — Discharge Instructions (Signed)
Flank Pain °Flank pain refers to pain that is located on the side of the body between the upper abdomen and the back. The pain may occur over a short period of time (acute) or may be long-term or reoccurring (chronic). It may be mild or severe. Flank pain can be caused by many things. °CAUSES  °Some of the more common causes of flank pain include: °· Muscle strains.   °· Muscle spasms.   °· A disease of your spine (vertebral disk disease).   °· A lung infection (pneumonia).   °· Fluid around your lungs (pulmonary edema).   °· A kidney infection.   °· Kidney stones.   °· A very painful skin rash caused by the chickenpox virus (shingles).   °· Gallbladder disease.   °HOME CARE INSTRUCTIONS  °Home care will depend on the cause of your pain. In general, °· Rest as directed by your caregiver. °· Drink enough fluids to keep your urine clear or pale yellow. °· Only take over-the-counter or prescription medicines as directed by your caregiver. Some medicines may help relieve the pain. °· Tell your caregiver about any changes in your pain. °· Follow up with your caregiver as directed. °SEEK IMMEDIATE MEDICAL CARE IF:  °· Your pain is not controlled with medicine.   °· You have new or worsening symptoms. °· Your pain increases.   °· You have abdominal pain.   °· You have shortness of breath.   °· You have persistent nausea or vomiting.   °· You have swelling in your abdomen.   °· You feel faint or pass out.   °· You have blood in your urine. °· You have a fever or persistent symptoms for more than 2-3 days. °· You have a fever and your symptoms suddenly get worse. °MAKE SURE YOU:  °· Understand these instructions. °· Will watch your condition. °· Will get help right away if you are not doing well or get worse. °Document Released: 11/04/2005 Document Revised: 06/07/2012 Document Reviewed: 04/27/2012 °[employer]® Patient Information ©2015 [employer]. This information is not intended to replace advice given to you by your  health care provider. Make sure you discuss any questions you have with your health care provider. ° °

## 2014-08-02 NOTE — ED Provider Notes (Signed)
Patient presented to the ER with left sided flank pain. Patient recently treated for urinary tract infection, just finished a ten-daycourse of Ceftintoday. Patient expressing increased pain in the flank area. No fever, vomiting or diarrhea. Patient denies cough, shortness of breath, chest pain. She has follow-up with urologist scheduled in 3 days.  Face to face Exam: HEENT - PERRLA Lungs - CTAB Heart - RRR, no M/R/G Abd - S/NT/ND Neuro - alert, oriented x3  Plan: Repeat blood work and urinalysis. Unremarkable. Patient underwent CT scan to further evaluate. No acute intra-abdominal pathology seen. No ureterolithiasis or hydronephrosis. Patient does have pulmonary changes of unclear significance. Patient not experiencing any shortness of breath or cough. Will refer her to pulmonology for further evaluation. Analgesia, follow up with neurology and primary care doctor.   Orpah Greek, MD 08/02/14 2017

## 2014-08-02 NOTE — ED Notes (Signed)
Patient transported to CT 

## 2014-08-05 DIAGNOSIS — R312 Other microscopic hematuria: Secondary | ICD-10-CM | POA: Diagnosis not present

## 2014-08-05 DIAGNOSIS — M545 Low back pain: Secondary | ICD-10-CM | POA: Diagnosis not present

## 2014-08-13 DIAGNOSIS — F312 Bipolar disorder, current episode manic severe with psychotic features: Secondary | ICD-10-CM | POA: Diagnosis not present

## 2014-09-17 DIAGNOSIS — M2550 Pain in unspecified joint: Secondary | ICD-10-CM | POA: Diagnosis not present

## 2014-09-17 DIAGNOSIS — E669 Obesity, unspecified: Secondary | ICD-10-CM | POA: Diagnosis not present

## 2014-09-17 DIAGNOSIS — R312 Other microscopic hematuria: Secondary | ICD-10-CM | POA: Diagnosis not present

## 2014-09-17 DIAGNOSIS — Z79899 Other long term (current) drug therapy: Secondary | ICD-10-CM | POA: Diagnosis not present

## 2014-10-31 DIAGNOSIS — F312 Bipolar disorder, current episode manic severe with psychotic features: Secondary | ICD-10-CM | POA: Diagnosis not present

## 2014-11-06 DIAGNOSIS — H1132 Conjunctival hemorrhage, left eye: Secondary | ICD-10-CM | POA: Diagnosis not present

## 2014-11-06 DIAGNOSIS — H35373 Puckering of macula, bilateral: Secondary | ICD-10-CM | POA: Diagnosis not present

## 2014-11-06 DIAGNOSIS — H2513 Age-related nuclear cataract, bilateral: Secondary | ICD-10-CM | POA: Diagnosis not present

## 2014-11-06 DIAGNOSIS — H25013 Cortical age-related cataract, bilateral: Secondary | ICD-10-CM | POA: Diagnosis not present

## 2014-11-06 DIAGNOSIS — E11311 Type 2 diabetes mellitus with unspecified diabetic retinopathy with macular edema: Secondary | ICD-10-CM | POA: Diagnosis not present

## 2014-11-12 DIAGNOSIS — Z79891 Long term (current) use of opiate analgesic: Secondary | ICD-10-CM | POA: Diagnosis not present

## 2015-01-01 DIAGNOSIS — M545 Low back pain: Secondary | ICD-10-CM | POA: Diagnosis not present

## 2015-01-14 DIAGNOSIS — M159 Polyosteoarthritis, unspecified: Secondary | ICD-10-CM | POA: Diagnosis not present

## 2015-01-14 DIAGNOSIS — N182 Chronic kidney disease, stage 2 (mild): Secondary | ICD-10-CM | POA: Diagnosis not present

## 2015-01-14 DIAGNOSIS — E039 Hypothyroidism, unspecified: Secondary | ICD-10-CM | POA: Diagnosis not present

## 2015-01-14 DIAGNOSIS — Z79891 Long term (current) use of opiate analgesic: Secondary | ICD-10-CM | POA: Diagnosis not present

## 2015-01-29 DIAGNOSIS — M545 Low back pain: Secondary | ICD-10-CM | POA: Diagnosis not present

## 2015-02-11 DIAGNOSIS — F312 Bipolar disorder, current episode manic severe with psychotic features: Secondary | ICD-10-CM | POA: Diagnosis not present

## 2015-03-20 DIAGNOSIS — M2550 Pain in unspecified joint: Secondary | ICD-10-CM | POA: Diagnosis not present

## 2015-03-20 DIAGNOSIS — Z79891 Long term (current) use of opiate analgesic: Secondary | ICD-10-CM | POA: Diagnosis not present

## 2015-03-20 DIAGNOSIS — Z79899 Other long term (current) drug therapy: Secondary | ICD-10-CM | POA: Diagnosis not present

## 2015-03-20 DIAGNOSIS — M1990 Unspecified osteoarthritis, unspecified site: Secondary | ICD-10-CM | POA: Diagnosis not present

## 2015-03-20 DIAGNOSIS — E039 Hypothyroidism, unspecified: Secondary | ICD-10-CM | POA: Diagnosis not present

## 2015-05-12 DIAGNOSIS — Z1231 Encounter for screening mammogram for malignant neoplasm of breast: Secondary | ICD-10-CM | POA: Diagnosis not present

## 2015-05-15 DIAGNOSIS — F312 Bipolar disorder, current episode manic severe with psychotic features: Secondary | ICD-10-CM | POA: Diagnosis not present

## 2015-05-29 DIAGNOSIS — E039 Hypothyroidism, unspecified: Secondary | ICD-10-CM | POA: Diagnosis not present

## 2015-05-29 DIAGNOSIS — M25561 Pain in right knee: Secondary | ICD-10-CM | POA: Diagnosis not present

## 2015-05-29 DIAGNOSIS — Z79899 Other long term (current) drug therapy: Secondary | ICD-10-CM | POA: Diagnosis not present

## 2015-05-29 DIAGNOSIS — R809 Proteinuria, unspecified: Secondary | ICD-10-CM | POA: Diagnosis not present

## 2015-05-29 DIAGNOSIS — Z79891 Long term (current) use of opiate analgesic: Secondary | ICD-10-CM | POA: Diagnosis not present

## 2015-05-29 DIAGNOSIS — N183 Chronic kidney disease, stage 3 (moderate): Secondary | ICD-10-CM | POA: Diagnosis not present

## 2015-05-29 DIAGNOSIS — M1990 Unspecified osteoarthritis, unspecified site: Secondary | ICD-10-CM | POA: Diagnosis not present

## 2015-05-29 DIAGNOSIS — M2550 Pain in unspecified joint: Secondary | ICD-10-CM | POA: Diagnosis not present

## 2015-06-09 DIAGNOSIS — M25561 Pain in right knee: Secondary | ICD-10-CM | POA: Diagnosis not present

## 2015-07-03 DIAGNOSIS — M1000 Idiopathic gout, unspecified site: Secondary | ICD-10-CM | POA: Diagnosis not present

## 2015-07-03 DIAGNOSIS — Z79891 Long term (current) use of opiate analgesic: Secondary | ICD-10-CM | POA: Diagnosis not present

## 2015-07-03 DIAGNOSIS — F17200 Nicotine dependence, unspecified, uncomplicated: Secondary | ICD-10-CM | POA: Diagnosis not present

## 2015-07-03 DIAGNOSIS — J300 Vasomotor rhinitis: Secondary | ICD-10-CM | POA: Diagnosis not present

## 2015-07-03 DIAGNOSIS — Z23 Encounter for immunization: Secondary | ICD-10-CM | POA: Diagnosis not present

## 2015-07-03 DIAGNOSIS — I2510 Atherosclerotic heart disease of native coronary artery without angina pectoris: Secondary | ICD-10-CM | POA: Diagnosis not present

## 2015-07-03 DIAGNOSIS — E7800 Pure hypercholesterolemia, unspecified: Secondary | ICD-10-CM | POA: Diagnosis not present

## 2015-07-07 DIAGNOSIS — M25561 Pain in right knee: Secondary | ICD-10-CM | POA: Diagnosis not present

## 2015-07-29 DIAGNOSIS — G5731 Lesion of lateral popliteal nerve, right lower limb: Secondary | ICD-10-CM | POA: Diagnosis not present

## 2015-07-29 DIAGNOSIS — Z6830 Body mass index (BMI) 30.0-30.9, adult: Secondary | ICD-10-CM | POA: Diagnosis not present

## 2015-07-31 DIAGNOSIS — M25561 Pain in right knee: Secondary | ICD-10-CM | POA: Diagnosis not present

## 2015-07-31 DIAGNOSIS — S83281A Other tear of lateral meniscus, current injury, right knee, initial encounter: Secondary | ICD-10-CM | POA: Diagnosis not present

## 2015-07-31 DIAGNOSIS — M79604 Pain in right leg: Secondary | ICD-10-CM | POA: Diagnosis not present

## 2015-07-31 DIAGNOSIS — R200 Anesthesia of skin: Secondary | ICD-10-CM | POA: Diagnosis not present

## 2015-08-20 DIAGNOSIS — F312 Bipolar disorder, current episode manic severe with psychotic features: Secondary | ICD-10-CM | POA: Diagnosis not present

## 2015-08-25 DIAGNOSIS — E038 Other specified hypothyroidism: Secondary | ICD-10-CM | POA: Diagnosis not present

## 2015-08-25 DIAGNOSIS — N183 Chronic kidney disease, stage 3 (moderate): Secondary | ICD-10-CM | POA: Diagnosis not present

## 2015-08-25 DIAGNOSIS — Z6830 Body mass index (BMI) 30.0-30.9, adult: Secondary | ICD-10-CM | POA: Diagnosis not present

## 2015-08-25 DIAGNOSIS — Z79891 Long term (current) use of opiate analgesic: Secondary | ICD-10-CM | POA: Diagnosis not present

## 2015-08-25 DIAGNOSIS — M159 Polyosteoarthritis, unspecified: Secondary | ICD-10-CM | POA: Diagnosis not present

## 2015-08-25 DIAGNOSIS — M2550 Pain in unspecified joint: Secondary | ICD-10-CM | POA: Diagnosis not present

## 2015-08-25 DIAGNOSIS — Z23 Encounter for immunization: Secondary | ICD-10-CM | POA: Diagnosis not present

## 2015-08-25 DIAGNOSIS — M609 Myositis, unspecified: Secondary | ICD-10-CM | POA: Diagnosis not present

## 2015-08-25 DIAGNOSIS — I119 Hypertensive heart disease without heart failure: Secondary | ICD-10-CM | POA: Diagnosis not present

## 2015-08-25 DIAGNOSIS — Z79899 Other long term (current) drug therapy: Secondary | ICD-10-CM | POA: Diagnosis not present

## 2015-10-29 DIAGNOSIS — F312 Bipolar disorder, current episode manic severe with psychotic features: Secondary | ICD-10-CM | POA: Diagnosis not present

## 2015-11-18 DIAGNOSIS — Z6828 Body mass index (BMI) 28.0-28.9, adult: Secondary | ICD-10-CM | POA: Diagnosis not present

## 2015-11-18 DIAGNOSIS — R809 Proteinuria, unspecified: Secondary | ICD-10-CM | POA: Diagnosis not present

## 2015-11-18 DIAGNOSIS — Z79891 Long term (current) use of opiate analgesic: Secondary | ICD-10-CM | POA: Diagnosis not present

## 2015-11-18 DIAGNOSIS — Z79899 Other long term (current) drug therapy: Secondary | ICD-10-CM | POA: Diagnosis not present

## 2015-11-18 DIAGNOSIS — M791 Myalgia: Secondary | ICD-10-CM | POA: Diagnosis not present

## 2015-11-18 DIAGNOSIS — M2550 Pain in unspecified joint: Secondary | ICD-10-CM | POA: Diagnosis not present

## 2015-11-18 DIAGNOSIS — M1990 Unspecified osteoarthritis, unspecified site: Secondary | ICD-10-CM | POA: Diagnosis not present

## 2015-11-18 DIAGNOSIS — I119 Hypertensive heart disease without heart failure: Secondary | ICD-10-CM | POA: Diagnosis not present

## 2015-11-18 DIAGNOSIS — E039 Hypothyroidism, unspecified: Secondary | ICD-10-CM | POA: Diagnosis not present

## 2015-11-28 DIAGNOSIS — M1711 Unilateral primary osteoarthritis, right knee: Secondary | ICD-10-CM | POA: Diagnosis not present

## 2015-12-05 DIAGNOSIS — M1711 Unilateral primary osteoarthritis, right knee: Secondary | ICD-10-CM | POA: Diagnosis not present

## 2015-12-12 DIAGNOSIS — M1711 Unilateral primary osteoarthritis, right knee: Secondary | ICD-10-CM | POA: Diagnosis not present

## 2015-12-18 DIAGNOSIS — M545 Low back pain: Secondary | ICD-10-CM | POA: Diagnosis not present

## 2015-12-29 DIAGNOSIS — M545 Low back pain: Secondary | ICD-10-CM | POA: Diagnosis not present

## 2016-01-12 DIAGNOSIS — M25561 Pain in right knee: Secondary | ICD-10-CM | POA: Diagnosis not present

## 2016-01-12 DIAGNOSIS — M4806 Spinal stenosis, lumbar region: Secondary | ICD-10-CM | POA: Diagnosis not present

## 2016-01-12 DIAGNOSIS — M1711 Unilateral primary osteoarthritis, right knee: Secondary | ICD-10-CM | POA: Diagnosis not present

## 2016-02-03 DIAGNOSIS — M1990 Unspecified osteoarthritis, unspecified site: Secondary | ICD-10-CM | POA: Diagnosis not present

## 2016-02-03 DIAGNOSIS — Z79891 Long term (current) use of opiate analgesic: Secondary | ICD-10-CM | POA: Diagnosis not present

## 2016-02-03 DIAGNOSIS — M791 Myalgia: Secondary | ICD-10-CM | POA: Diagnosis not present

## 2016-02-03 DIAGNOSIS — M2550 Pain in unspecified joint: Secondary | ICD-10-CM | POA: Diagnosis not present

## 2016-02-03 DIAGNOSIS — Z79899 Other long term (current) drug therapy: Secondary | ICD-10-CM | POA: Diagnosis not present

## 2016-02-03 DIAGNOSIS — R809 Proteinuria, unspecified: Secondary | ICD-10-CM | POA: Diagnosis not present

## 2016-02-03 DIAGNOSIS — I119 Hypertensive heart disease without heart failure: Secondary | ICD-10-CM | POA: Diagnosis not present

## 2016-02-03 DIAGNOSIS — E039 Hypothyroidism, unspecified: Secondary | ICD-10-CM | POA: Diagnosis not present

## 2016-02-03 DIAGNOSIS — Z6828 Body mass index (BMI) 28.0-28.9, adult: Secondary | ICD-10-CM | POA: Diagnosis not present

## 2016-02-16 DIAGNOSIS — R319 Hematuria, unspecified: Secondary | ICD-10-CM | POA: Diagnosis not present

## 2016-02-16 DIAGNOSIS — R1032 Left lower quadrant pain: Secondary | ICD-10-CM | POA: Diagnosis not present

## 2016-03-11 DIAGNOSIS — Z01419 Encounter for gynecological examination (general) (routine) without abnormal findings: Secondary | ICD-10-CM | POA: Diagnosis not present

## 2016-03-11 DIAGNOSIS — Z124 Encounter for screening for malignant neoplasm of cervix: Secondary | ICD-10-CM | POA: Diagnosis not present

## 2016-03-13 DIAGNOSIS — M5431 Sciatica, right side: Secondary | ICD-10-CM | POA: Diagnosis not present

## 2016-04-02 DIAGNOSIS — M1711 Unilateral primary osteoarthritis, right knee: Secondary | ICD-10-CM | POA: Diagnosis not present

## 2016-04-02 DIAGNOSIS — M4316 Spondylolisthesis, lumbar region: Secondary | ICD-10-CM | POA: Diagnosis not present

## 2016-04-20 DIAGNOSIS — M791 Myalgia: Secondary | ICD-10-CM | POA: Diagnosis not present

## 2016-04-20 DIAGNOSIS — I119 Hypertensive heart disease without heart failure: Secondary | ICD-10-CM | POA: Diagnosis not present

## 2016-04-20 DIAGNOSIS — Z5181 Encounter for therapeutic drug level monitoring: Secondary | ICD-10-CM | POA: Diagnosis not present

## 2016-04-20 DIAGNOSIS — Z6829 Body mass index (BMI) 29.0-29.9, adult: Secondary | ICD-10-CM | POA: Diagnosis not present

## 2016-04-20 DIAGNOSIS — M2550 Pain in unspecified joint: Secondary | ICD-10-CM | POA: Diagnosis not present

## 2016-04-20 DIAGNOSIS — M5431 Sciatica, right side: Secondary | ICD-10-CM | POA: Diagnosis not present

## 2016-04-20 DIAGNOSIS — E039 Hypothyroidism, unspecified: Secondary | ICD-10-CM | POA: Diagnosis not present

## 2016-04-20 DIAGNOSIS — Z79899 Other long term (current) drug therapy: Secondary | ICD-10-CM | POA: Diagnosis not present

## 2016-04-20 DIAGNOSIS — R809 Proteinuria, unspecified: Secondary | ICD-10-CM | POA: Diagnosis not present

## 2016-04-20 DIAGNOSIS — Z79891 Long term (current) use of opiate analgesic: Secondary | ICD-10-CM | POA: Diagnosis not present

## 2016-04-20 DIAGNOSIS — M159 Polyosteoarthritis, unspecified: Secondary | ICD-10-CM | POA: Diagnosis not present

## 2016-05-27 DIAGNOSIS — N23 Unspecified renal colic: Secondary | ICD-10-CM | POA: Diagnosis not present

## 2016-05-27 DIAGNOSIS — R319 Hematuria, unspecified: Secondary | ICD-10-CM | POA: Diagnosis not present

## 2016-06-21 ENCOUNTER — Other Ambulatory Visit (HOSPITAL_COMMUNITY): Payer: Self-pay | Admitting: Pulmonary Disease

## 2016-06-21 ENCOUNTER — Ambulatory Visit (HOSPITAL_COMMUNITY)
Admission: RE | Admit: 2016-06-21 | Discharge: 2016-06-21 | Disposition: A | Discharge status: Home / Self Care | Payer: Medicare Other | Source: Ambulatory Visit | Attending: Pulmonary Disease | Admitting: Pulmonary Disease

## 2016-06-21 DIAGNOSIS — E039 Hypothyroidism, unspecified: Secondary | ICD-10-CM | POA: Diagnosis not present

## 2016-06-21 DIAGNOSIS — R809 Proteinuria, unspecified: Secondary | ICD-10-CM | POA: Diagnosis not present

## 2016-06-21 DIAGNOSIS — M47816 Spondylosis without myelopathy or radiculopathy, lumbar region: Secondary | ICD-10-CM | POA: Diagnosis not present

## 2016-06-21 DIAGNOSIS — M159 Polyosteoarthritis, unspecified: Secondary | ICD-10-CM | POA: Diagnosis not present

## 2016-06-21 DIAGNOSIS — I119 Hypertensive heart disease without heart failure: Secondary | ICD-10-CM | POA: Diagnosis not present

## 2016-06-21 DIAGNOSIS — Z23 Encounter for immunization: Secondary | ICD-10-CM | POA: Diagnosis not present

## 2016-06-21 DIAGNOSIS — G8929 Other chronic pain: Secondary | ICD-10-CM | POA: Diagnosis not present

## 2016-06-21 DIAGNOSIS — M545 Low back pain, unspecified: Secondary | ICD-10-CM

## 2016-06-21 DIAGNOSIS — Z79899 Other long term (current) drug therapy: Secondary | ICD-10-CM | POA: Diagnosis not present

## 2016-06-21 DIAGNOSIS — Z79891 Long term (current) use of opiate analgesic: Secondary | ICD-10-CM | POA: Diagnosis not present

## 2016-06-21 DIAGNOSIS — M609 Myositis, unspecified: Secondary | ICD-10-CM | POA: Diagnosis not present

## 2016-06-21 DIAGNOSIS — N390 Urinary tract infection, site not specified: Secondary | ICD-10-CM | POA: Diagnosis not present

## 2016-06-21 DIAGNOSIS — M546 Pain in thoracic spine: Secondary | ICD-10-CM | POA: Insufficient documentation

## 2016-06-21 DIAGNOSIS — M2550 Pain in unspecified joint: Secondary | ICD-10-CM | POA: Diagnosis not present

## 2016-07-05 DIAGNOSIS — Z1231 Encounter for screening mammogram for malignant neoplasm of breast: Secondary | ICD-10-CM | POA: Diagnosis not present

## 2016-07-20 ENCOUNTER — Encounter (HOSPITAL_COMMUNITY): Payer: Self-pay | Admitting: *Deleted

## 2016-07-20 ENCOUNTER — Emergency Department (HOSPITAL_COMMUNITY)
Admission: EM | Admit: 2016-07-20 | Discharge: 2016-07-20 | Disposition: A | Discharge status: Home / Self Care | Payer: Worker's Compensation | Attending: Emergency Medicine | Admitting: Emergency Medicine

## 2016-07-20 DIAGNOSIS — Z7982 Long term (current) use of aspirin: Secondary | ICD-10-CM | POA: Diagnosis not present

## 2016-07-20 DIAGNOSIS — W01198A Fall on same level from slipping, tripping and stumbling with subsequent striking against other object, initial encounter: Secondary | ICD-10-CM | POA: Diagnosis not present

## 2016-07-20 DIAGNOSIS — I2510 Atherosclerotic heart disease of native coronary artery without angina pectoris: Secondary | ICD-10-CM | POA: Diagnosis not present

## 2016-07-20 DIAGNOSIS — Z79899 Other long term (current) drug therapy: Secondary | ICD-10-CM | POA: Diagnosis not present

## 2016-07-20 DIAGNOSIS — Y999 Unspecified external cause status: Secondary | ICD-10-CM | POA: Insufficient documentation

## 2016-07-20 DIAGNOSIS — Y939 Activity, unspecified: Secondary | ICD-10-CM | POA: Insufficient documentation

## 2016-07-20 DIAGNOSIS — I10 Essential (primary) hypertension: Secondary | ICD-10-CM | POA: Diagnosis not present

## 2016-07-20 DIAGNOSIS — S0181XA Laceration without foreign body of other part of head, initial encounter: Secondary | ICD-10-CM

## 2016-07-20 DIAGNOSIS — Y929 Unspecified place or not applicable: Secondary | ICD-10-CM | POA: Insufficient documentation

## 2016-07-20 DIAGNOSIS — Z23 Encounter for immunization: Secondary | ICD-10-CM | POA: Diagnosis not present

## 2016-07-20 MED ORDER — LIDOCAINE HCL 2 % IJ SOLN
10.0000 mL | Freq: Once | INTRAMUSCULAR | Status: AC
  Administered 2016-07-20: 20 mg
  Filled 2016-07-20: qty 20

## 2016-07-20 MED ORDER — TETANUS-DIPHTH-ACELL PERTUSSIS 5-2.5-18.5 LF-MCG/0.5 IM SUSP
0.5000 mL | Freq: Once | INTRAMUSCULAR | Status: AC
  Administered 2016-07-20: 0.5 mL via INTRAMUSCULAR
  Filled 2016-07-20: qty 0.5

## 2016-07-20 NOTE — Discharge Instructions (Signed)
Please read attached information. If you experience any new or worsening signs or symptoms please return to the emergency room for evaluation. Please follow-up with your primary care provider or specialist as discussed.  °

## 2016-07-20 NOTE — ED Provider Notes (Signed)
[facility] DEPT Provider Note   CSN: [id number] Arrival date & time: 07/20/16  1617  By signing my name below, I, Julien Nordmann, attest that this documentation has been prepared under the direction and in the presence of [employer], PA-C.  Electronically Signed: Julien Nordmann, ED Scribe. 07/20/16. 5:14 PM.    History   Chief Complaint Chief Complaint  Patient presents with  . Facial Laceration     The history is provided by the patient. No language interpreter was used.   HPI Comments: Emily Murray is a 65 y.o. female who presents to the Emergency Department complaining of a chin laceration s/p a mechanical fall that occurred PTA. Pt states she tripped and fell, hitting her chin on a cemented parking guardrail. She did not lose consciousness. She has full active ROM of her jaw. There are no other complaints.  Past Medical History:  Diagnosis Date  . Anxiety   . Bipolar 1 disorder ([place])   . Coronary artery disease   . Depressed   . Hypertension     There are no active problems to display for this patient.   Past Surgical History:  Procedure Laterality Date  . ABDOMINAL HYSTERECTOMY      OB History    No data available       Home Medications    Prior to Admission medications   Medication Sig Start Date End Date Taking? Authorizing Provider  aspirin 81 MG tablet Take 81 mg by mouth daily.    Historical Provider, MD  clonazePAM (KLONOPIN) 0.5 MG tablet Take 0.5 mg by mouth 2 (two) times daily as needed. For aneixty     Historical Provider, MD  cyclobenzaprine (FLEXERIL) 10 MG tablet Take 0.5 tablets (5 mg total) by mouth 2 (two) times daily as needed for muscle spasms. 07/11/13   [name], PA-C  Estrogens Conjugated (PREMARIN PO) Take 1 tablet by mouth daily.    Historical Provider, MD  estrogens conjugated, synthetic A, (CENESTIN) 0.625 MG tablet Take 0.625 mg by mouth daily.      Historical Provider, MD  folic acid (FOLVITE) 1 MG tablet  Take 1 mg by mouth daily.    Historical Provider, MD  Levothyroxine Sodium (SYNTHROID PO) Take 1 tablet by mouth daily.    Historical Provider, MD  naproxen sodium (ANAPROX) 220 MG tablet Take 660 mg by mouth 2 (two) times daily as needed (for pain).    Historical Provider, MD  olmesartan-hydrochlorothiazide (BENICAR HCT) 40-12.5 MG per tablet Take 1 tablet by mouth daily.      Historical Provider, MD  oxyCODONE-acetaminophen (PERCOCET/ROXICET) 5-325 MG per tablet Take 1 tablet by mouth every 6 (six) hours as needed for severe pain. 08/02/14   [name], NP  phenazopyridine (PYRIDIUM) 200 MG tablet Take 200 mg by mouth 3 (three) times daily as needed for pain.    Historical Provider, MD  sertraline (ZOLOFT) 100 MG tablet Take 200 mg by mouth daily after breakfast.      Historical Provider, MD  sulfamethoxazole-trimethoprim (BACTRIM DS) 800-160 MG per tablet Take 1 tablet by mouth 2 (two) times daily.    Historical Provider, MD  Vitamin D, Ergocalciferol, (DRISDOL) 50000 UNITS CAPS capsule Take 50,000 Units by mouth every 7 (seven) days.    Historical Provider, MD    Family History No family history on file.  Social History Social History  Substance Use Topics  . Smoking status: Never Smoker  . Smokeless tobacco: Never Used  . Alcohol use No  Allergies   Review of patient's allergies indicates no known allergies.   Review of Systems Review of Systems  Skin: Positive for wound.  All other systems reviewed and are negative.    Physical Exam Updated Vital Signs BP 153/93 (BP Location: Right Arm)   Pulse 75   Temp 98.2 F (36.8 C) (Oral)   Resp 18   SpO2 98%   Physical Exam  Constitutional: She is oriented to person, place, and time. She appears well-developed and well-nourished.  HENT:  Head: Normocephalic and atraumatic.  Right Ear: External ear normal.  Left Ear: External ear normal.  Eyes: Conjunctivae are normal. No scleral icterus.  Neck: No tracheal deviation  present.  Pulmonary/Chest: Effort normal. No respiratory distress.  Abdominal: She exhibits no distension.  Musculoskeletal: Normal range of motion.  Neurological: She is alert and oriented to person, place, and time.  Skin: Skin is warm and dry.  1.5 cm laceration to the chin  Psychiatric: She has a normal mood and affect. Her behavior is normal.  Nursing note and vitals reviewed.    ED Treatments / Results  DIAGNOSTIC STUDIES: Oxygen Saturation is 98% on RA, normal by my interpretation.  COORDINATION OF CARE:  4:45 PM Discussed treatment plan which includes suture the laceration with pt at bedside and pt agreed to plan.  Labs (all labs ordered are listed, but only abnormal results are displayed) Labs Reviewed - No data to display  EKG  EKG Interpretation None       Radiology No results found.  Procedures Procedures (including critical care time)  LACERATION REPAIR Performed by: Elmer Ramp Authorized by: Elmer Ramp Consent: Verbal consent obtained. Risks and benefits: risks, benefits and alternatives were discussed Consent given by: patient Patient identity confirmed: provided demographic data Prepped and Draped in normal sterile fashion Wound explored  Laceration Location: Chin  Laceration Length: 1.5 cm  No Foreign Bodies seen or palpated  Anesthesia: local infiltration  Local anesthetic: lidocaine 2% 0 epinephrine  Anesthetic total: 1 ml  Irrigation method: syringe Amount of cleaning: standard  Skin closure: Simple   Number of sutures: 3   Technique: Simple interrupted   Patient tolerance: Patient tolerated the procedure well with no immediate complications.  Medications Ordered in ED Medications  lidocaine (XYLOCAINE) 2 % (with pres) injection 200 mg (20 mg Infiltration Given by Other 07/20/16 1638)  Tdap (BOOSTRIX) injection 0.5 mL (0.5 mLs Intramuscular Given 07/20/16 1638)     Initial Impression / Assessment and  Plan / ED Course  I have reviewed the triage vital signs and the nursing notes.  Pertinent labs & imaging results that were available during my care of the patient were reviewed by me and considered in my medical decision making (see chart for details).  Clinical Course     Final Clinical Impressions(s) / ED Diagnoses   Final diagnoses:  Facial laceration, initial encounter   Labs:  Imaging:  Consults:  Therapeutics:  Discharge Meds:   Assessment/Plan: Suture the area.   Patient presents with facial laceration, no other injuries from the fall. No loss of consciousness. Laceration repair, tetanus updated. Wound care instructions given return precautions given. New Prescriptions Discharge Medication List as of 07/20/2016  5:18 PM       Okey Regal, PA-C 07/20/16 2029    Virgel Manifold, MD 07/21/16 [phone]

## 2016-07-20 NOTE — ED Triage Notes (Signed)
Per pt report: pt tripped a parking guardrail and hit her chin.  Pt has a small laceration to her chin.  Bleeding currently controlled.

## 2016-07-22 DIAGNOSIS — F312 Bipolar disorder, current episode manic severe with psychotic features: Secondary | ICD-10-CM | POA: Diagnosis not present

## 2016-08-16 DIAGNOSIS — M2550 Pain in unspecified joint: Secondary | ICD-10-CM | POA: Diagnosis not present

## 2016-08-16 DIAGNOSIS — Z6829 Body mass index (BMI) 29.0-29.9, adult: Secondary | ICD-10-CM | POA: Diagnosis not present

## 2016-08-16 DIAGNOSIS — M25561 Pain in right knee: Secondary | ICD-10-CM | POA: Diagnosis not present

## 2016-08-16 DIAGNOSIS — I119 Hypertensive heart disease without heart failure: Secondary | ICD-10-CM | POA: Diagnosis not present

## 2016-08-16 DIAGNOSIS — E559 Vitamin D deficiency, unspecified: Secondary | ICD-10-CM | POA: Diagnosis not present

## 2016-08-16 DIAGNOSIS — Z79891 Long term (current) use of opiate analgesic: Secondary | ICD-10-CM | POA: Diagnosis not present

## 2016-08-16 DIAGNOSIS — Z79899 Other long term (current) drug therapy: Secondary | ICD-10-CM | POA: Diagnosis not present

## 2016-08-16 DIAGNOSIS — R809 Proteinuria, unspecified: Secondary | ICD-10-CM | POA: Diagnosis not present

## 2016-08-16 DIAGNOSIS — G4700 Insomnia, unspecified: Secondary | ICD-10-CM | POA: Diagnosis not present

## 2016-08-16 DIAGNOSIS — E039 Hypothyroidism, unspecified: Secondary | ICD-10-CM | POA: Diagnosis not present

## 2016-08-16 DIAGNOSIS — M159 Polyosteoarthritis, unspecified: Secondary | ICD-10-CM | POA: Diagnosis not present

## 2016-08-16 DIAGNOSIS — M609 Myositis, unspecified: Secondary | ICD-10-CM | POA: Diagnosis not present

## 2016-11-11 DIAGNOSIS — R809 Proteinuria, unspecified: Secondary | ICD-10-CM | POA: Diagnosis not present

## 2016-11-11 DIAGNOSIS — G4700 Insomnia, unspecified: Secondary | ICD-10-CM | POA: Diagnosis not present

## 2016-11-11 DIAGNOSIS — I119 Hypertensive heart disease without heart failure: Secondary | ICD-10-CM | POA: Diagnosis not present

## 2016-11-11 DIAGNOSIS — E039 Hypothyroidism, unspecified: Secondary | ICD-10-CM | POA: Diagnosis not present

## 2016-11-11 DIAGNOSIS — E0590 Thyrotoxicosis, unspecified without thyrotoxic crisis or storm: Secondary | ICD-10-CM | POA: Diagnosis not present

## 2016-11-11 DIAGNOSIS — Z6829 Body mass index (BMI) 29.0-29.9, adult: Secondary | ICD-10-CM | POA: Diagnosis not present

## 2016-11-11 DIAGNOSIS — M791 Myalgia: Secondary | ICD-10-CM | POA: Diagnosis not present

## 2016-11-11 DIAGNOSIS — E559 Vitamin D deficiency, unspecified: Secondary | ICD-10-CM | POA: Diagnosis not present

## 2016-11-11 DIAGNOSIS — M2550 Pain in unspecified joint: Secondary | ICD-10-CM | POA: Diagnosis not present

## 2016-11-11 DIAGNOSIS — M159 Polyosteoarthritis, unspecified: Secondary | ICD-10-CM | POA: Diagnosis not present

## 2016-11-11 DIAGNOSIS — Z79891 Long term (current) use of opiate analgesic: Secondary | ICD-10-CM | POA: Diagnosis not present

## 2016-11-11 DIAGNOSIS — Z79899 Other long term (current) drug therapy: Secondary | ICD-10-CM | POA: Diagnosis not present

## 2016-11-22 DIAGNOSIS — Z1159 Encounter for screening for other viral diseases: Secondary | ICD-10-CM | POA: Diagnosis not present

## 2016-11-22 DIAGNOSIS — R159 Full incontinence of feces: Secondary | ICD-10-CM | POA: Diagnosis not present

## 2017-01-05 DIAGNOSIS — R1084 Generalized abdominal pain: Secondary | ICD-10-CM | POA: Diagnosis not present

## 2017-01-05 DIAGNOSIS — R109 Unspecified abdominal pain: Secondary | ICD-10-CM | POA: Diagnosis not present

## 2017-01-05 DIAGNOSIS — R3129 Other microscopic hematuria: Secondary | ICD-10-CM | POA: Diagnosis not present

## 2017-02-14 DIAGNOSIS — Z8249 Family history of ischemic heart disease and other diseases of the circulatory system: Secondary | ICD-10-CM | POA: Diagnosis not present

## 2017-02-14 DIAGNOSIS — M2550 Pain in unspecified joint: Secondary | ICD-10-CM | POA: Diagnosis not present

## 2017-02-14 DIAGNOSIS — E559 Vitamin D deficiency, unspecified: Secondary | ICD-10-CM | POA: Diagnosis not present

## 2017-02-14 DIAGNOSIS — E039 Hypothyroidism, unspecified: Secondary | ICD-10-CM | POA: Diagnosis not present

## 2017-02-14 DIAGNOSIS — Z79891 Long term (current) use of opiate analgesic: Secondary | ICD-10-CM | POA: Diagnosis not present

## 2017-02-14 DIAGNOSIS — N951 Menopausal and female climacteric states: Secondary | ICD-10-CM | POA: Diagnosis not present

## 2017-02-14 DIAGNOSIS — M791 Myalgia: Secondary | ICD-10-CM | POA: Diagnosis not present

## 2017-02-14 DIAGNOSIS — R809 Proteinuria, unspecified: Secondary | ICD-10-CM | POA: Diagnosis not present

## 2017-02-14 DIAGNOSIS — M25561 Pain in right knee: Secondary | ICD-10-CM | POA: Diagnosis not present

## 2017-02-14 DIAGNOSIS — Z79899 Other long term (current) drug therapy: Secondary | ICD-10-CM | POA: Diagnosis not present

## 2017-02-14 DIAGNOSIS — G4700 Insomnia, unspecified: Secondary | ICD-10-CM | POA: Diagnosis not present

## 2017-02-14 DIAGNOSIS — I119 Hypertensive heart disease without heart failure: Secondary | ICD-10-CM | POA: Diagnosis not present

## 2017-02-17 DIAGNOSIS — M170 Bilateral primary osteoarthritis of knee: Secondary | ICD-10-CM | POA: Diagnosis not present

## 2017-02-17 DIAGNOSIS — R262 Difficulty in walking, not elsewhere classified: Secondary | ICD-10-CM | POA: Diagnosis not present

## 2017-02-17 DIAGNOSIS — M25561 Pain in right knee: Secondary | ICD-10-CM | POA: Diagnosis not present

## 2017-02-17 DIAGNOSIS — M25562 Pain in left knee: Secondary | ICD-10-CM | POA: Diagnosis not present

## 2017-03-19 DIAGNOSIS — H524 Presbyopia: Secondary | ICD-10-CM | POA: Diagnosis not present

## 2017-03-19 DIAGNOSIS — H5213 Myopia, bilateral: Secondary | ICD-10-CM | POA: Diagnosis not present

## 2017-03-19 DIAGNOSIS — H35373 Puckering of macula, bilateral: Secondary | ICD-10-CM | POA: Diagnosis not present

## 2017-03-19 DIAGNOSIS — H02403 Unspecified ptosis of bilateral eyelids: Secondary | ICD-10-CM | POA: Diagnosis not present

## 2017-03-19 DIAGNOSIS — H52223 Regular astigmatism, bilateral: Secondary | ICD-10-CM | POA: Diagnosis not present

## 2017-04-06 DIAGNOSIS — F312 Bipolar disorder, current episode manic severe with psychotic features: Secondary | ICD-10-CM | POA: Diagnosis not present

## 2017-04-28 DIAGNOSIS — N23 Unspecified renal colic: Secondary | ICD-10-CM | POA: Diagnosis not present

## 2017-04-28 DIAGNOSIS — I10 Essential (primary) hypertension: Secondary | ICD-10-CM | POA: Diagnosis not present

## 2017-04-28 DIAGNOSIS — R10814 Left lower quadrant abdominal tenderness: Secondary | ICD-10-CM | POA: Diagnosis not present

## 2017-04-28 DIAGNOSIS — R3121 Asymptomatic microscopic hematuria: Secondary | ICD-10-CM | POA: Diagnosis not present

## 2017-04-28 DIAGNOSIS — R1032 Left lower quadrant pain: Secondary | ICD-10-CM | POA: Diagnosis not present

## 2017-04-30 DIAGNOSIS — N23 Unspecified renal colic: Secondary | ICD-10-CM | POA: Diagnosis not present

## 2017-05-02 ENCOUNTER — Inpatient Hospital Stay (HOSPITAL_COMMUNITY)
Admission: EM | Admit: 2017-05-02 | Discharge: 2017-05-05 | DRG: 287 | Disposition: A | Discharge status: Home / Self Care | Payer: Medicare Other | Attending: Family Medicine | Admitting: Family Medicine

## 2017-05-02 ENCOUNTER — Emergency Department (HOSPITAL_COMMUNITY): Payer: Medicare Other

## 2017-05-02 ENCOUNTER — Other Ambulatory Visit: Payer: Self-pay

## 2017-05-02 ENCOUNTER — Encounter (HOSPITAL_COMMUNITY): Payer: Self-pay | Admitting: Emergency Medicine

## 2017-05-02 DIAGNOSIS — Z9119 Patient's noncompliance with other medical treatment and regimen: Secondary | ICD-10-CM

## 2017-05-02 DIAGNOSIS — G43109 Migraine with aura, not intractable, without status migrainosus: Secondary | ICD-10-CM | POA: Diagnosis present

## 2017-05-02 DIAGNOSIS — I10 Essential (primary) hypertension: Secondary | ICD-10-CM

## 2017-05-02 DIAGNOSIS — R402364 Coma scale, best motor response, obeys commands, 24 hours or more after hospital admission: Secondary | ICD-10-CM | POA: Diagnosis present

## 2017-05-02 DIAGNOSIS — R109 Unspecified abdominal pain: Secondary | ICD-10-CM | POA: Diagnosis not present

## 2017-05-02 DIAGNOSIS — Z7982 Long term (current) use of aspirin: Secondary | ICD-10-CM

## 2017-05-02 DIAGNOSIS — R29818 Other symptoms and signs involving the nervous system: Secondary | ICD-10-CM | POA: Diagnosis not present

## 2017-05-02 DIAGNOSIS — I4581 Long QT syndrome: Secondary | ICD-10-CM | POA: Diagnosis present

## 2017-05-02 DIAGNOSIS — Z23 Encounter for immunization: Secondary | ICD-10-CM

## 2017-05-02 DIAGNOSIS — R202 Paresthesia of skin: Secondary | ICD-10-CM

## 2017-05-02 DIAGNOSIS — Z791 Long term (current) use of non-steroidal anti-inflammatories (NSAID): Secondary | ICD-10-CM

## 2017-05-02 DIAGNOSIS — I639 Cerebral infarction, unspecified: Secondary | ICD-10-CM

## 2017-05-02 DIAGNOSIS — I2510 Atherosclerotic heart disease of native coronary artery without angina pectoris: Secondary | ICD-10-CM | POA: Insufficient documentation

## 2017-05-02 DIAGNOSIS — R072 Precordial pain: Secondary | ICD-10-CM | POA: Diagnosis not present

## 2017-05-02 DIAGNOSIS — R079 Chest pain, unspecified: Secondary | ICD-10-CM | POA: Diagnosis not present

## 2017-05-02 DIAGNOSIS — R29701 NIHSS score 1: Secondary | ICD-10-CM | POA: Diagnosis not present

## 2017-05-02 DIAGNOSIS — E039 Hypothyroidism, unspecified: Secondary | ICD-10-CM | POA: Diagnosis present

## 2017-05-02 DIAGNOSIS — I160 Hypertensive urgency: Secondary | ICD-10-CM | POA: Diagnosis not present

## 2017-05-02 DIAGNOSIS — R9431 Abnormal electrocardiogram [ECG] [EKG]: Secondary | ICD-10-CM

## 2017-05-02 DIAGNOSIS — Z8249 Family history of ischemic heart disease and other diseases of the circulatory system: Secondary | ICD-10-CM

## 2017-05-02 DIAGNOSIS — F419 Anxiety disorder, unspecified: Secondary | ICD-10-CM | POA: Diagnosis not present

## 2017-05-02 DIAGNOSIS — Z90710 Acquired absence of both cervix and uterus: Secondary | ICD-10-CM

## 2017-05-02 DIAGNOSIS — R402254 Coma scale, best verbal response, oriented, 24 hours or more after hospital admission: Secondary | ICD-10-CM | POA: Diagnosis present

## 2017-05-02 DIAGNOSIS — R1084 Generalized abdominal pain: Secondary | ICD-10-CM | POA: Diagnosis not present

## 2017-05-02 DIAGNOSIS — Z91018 Allergy to other foods: Secondary | ICD-10-CM

## 2017-05-02 DIAGNOSIS — F319 Bipolar disorder, unspecified: Secondary | ICD-10-CM

## 2017-05-02 DIAGNOSIS — M545 Low back pain: Secondary | ICD-10-CM | POA: Diagnosis not present

## 2017-05-02 DIAGNOSIS — R402144 Coma scale, eyes open, spontaneous, 24 hours or more after hospital admission: Secondary | ICD-10-CM | POA: Diagnosis present

## 2017-05-02 DIAGNOSIS — M48061 Spinal stenosis, lumbar region without neurogenic claudication: Secondary | ICD-10-CM | POA: Diagnosis present

## 2017-05-02 LAB — CBC
HCT: 39.6 % (ref 36.0–46.0)
Hemoglobin: 13.2 g/dL (ref 12.0–15.0)
MCH: 27.1 pg (ref 26.0–34.0)
MCHC: 33.3 g/dL (ref 30.0–36.0)
MCV: 81.3 fL (ref 78.0–100.0)
PLATELETS: 255 10*3/uL (ref 150–400)
RBC: 4.87 MIL/uL (ref 3.87–5.11)
RDW: 15.4 % (ref 11.5–15.5)
WBC: 11.2 10*3/uL — AB (ref 4.0–10.5)

## 2017-05-02 LAB — DIFFERENTIAL
BASOS PCT: 0 %
Basophils Absolute: 0 10*3/uL (ref 0.0–0.1)
EOS PCT: 1 %
Eosinophils Absolute: 0.1 10*3/uL (ref 0.0–0.7)
Lymphocytes Relative: 5 %
Lymphs Abs: 0.6 10*3/uL — ABNORMAL LOW (ref 0.7–4.0)
MONO ABS: 0.8 10*3/uL (ref 0.1–1.0)
Monocytes Relative: 7 %
Neutro Abs: 9.8 10*3/uL — ABNORMAL HIGH (ref 1.7–7.7)
Neutrophils Relative %: 87 %

## 2017-05-02 LAB — APTT: aPTT: 29 seconds (ref 24–36)

## 2017-05-02 LAB — COMPREHENSIVE METABOLIC PANEL
ALT: 25 U/L (ref 14–54)
ANION GAP: 8 (ref 5–15)
AST: 57 U/L — ABNORMAL HIGH (ref 15–41)
Albumin: 3.5 g/dL (ref 3.5–5.0)
Alkaline Phosphatase: 102 U/L (ref 38–126)
BUN: 14 mg/dL (ref 6–20)
CHLORIDE: 104 mmol/L (ref 101–111)
CO2: 23 mmol/L (ref 22–32)
Calcium: 9 mg/dL (ref 8.9–10.3)
Creatinine, Ser: 0.88 mg/dL (ref 0.44–1.00)
GFR calc non Af Amer: >60 mL/min (ref >60)
Glucose, Bld: 118 mg/dL — ABNORMAL HIGH (ref 65–99)
Potassium: 3.6 mmol/L (ref 3.5–5.1)
Sodium: 135 mmol/L (ref 135–145)
Total Bilirubin: 0.2 mg/dL — ABNORMAL LOW (ref 0.3–1.2)
Total Protein: 5.8 g/dL — ABNORMAL LOW (ref 6.5–8.1)

## 2017-05-02 LAB — I-STAT CHEM 8, ED
BUN: 16 mg/dL (ref 6–20)
Calcium, Ion: 1.15 mmol/L (ref 1.15–1.40)
Chloride: 104 mmol/L (ref 101–111)
Creatinine, Ser: 0.8 mg/dL (ref 0.44–1.00)
Glucose, Bld: 116 mg/dL — ABNORMAL HIGH (ref 65–99)
HEMATOCRIT: 42 % (ref 36.0–46.0)
HEMOGLOBIN: 14.3 g/dL (ref 12.0–15.0)
Potassium: 3.8 mmol/L (ref 3.5–5.1)
SODIUM: 139 mmol/L (ref 135–145)
TCO2: 25 mmol/L (ref 0–100)

## 2017-05-02 LAB — URINALYSIS, ROUTINE W REFLEX MICROSCOPIC
Bilirubin Urine: NEGATIVE
GLUCOSE, UA: NEGATIVE mg/dL
Hgb urine dipstick: NEGATIVE
KETONES UR: NEGATIVE mg/dL
LEUKOCYTES UA: NEGATIVE
NITRITE: NEGATIVE
PROTEIN: NEGATIVE mg/dL
Specific Gravity, Urine: 1.017 (ref 1.005–1.030)
pH: 6 (ref 5.0–8.0)

## 2017-05-02 LAB — I-STAT TROPONIN, ED: Troponin i, poc: 0.01 ng/mL (ref 0.00–0.08)

## 2017-05-02 LAB — CBG MONITORING, ED: GLUCOSE-CAPILLARY: 119 mg/dL — AB (ref 65–99)

## 2017-05-02 LAB — D-DIMER, QUANTITATIVE (NOT AT ARMC): D DIMER QUANT: 0.45 ug{FEU}/mL (ref 0.00–0.50)

## 2017-05-02 LAB — MAGNESIUM: MAGNESIUM: 1.6 mg/dL — AB (ref 1.7–2.4)

## 2017-05-02 LAB — TROPONIN I: Troponin I: <0.03 ng/mL (ref <0.03)

## 2017-05-02 LAB — PHOSPHORUS: PHOSPHORUS: 2.8 mg/dL (ref 2.5–4.6)

## 2017-05-02 LAB — PROTIME-INR
INR: 0.96
PROTHROMBIN TIME: 12.7 s (ref 11.4–15.2)

## 2017-05-02 MED ORDER — HYDROCODONE-ACETAMINOPHEN 5-325 MG PO TABS
0.5000 | ORAL_TABLET | Freq: Every morning | ORAL | Status: DC
  Administered 2017-05-02: 1 via ORAL
  Filled 2017-05-02: qty 1

## 2017-05-02 NOTE — H&P (Signed)
Emily Murray [id number] DOB: [birth year] DOA: 05/02/2017     PCP: Vincente Liberty, MD   Outpatient Specialists: none Patient coming from:   home Lives   With family    Chief Complaint:Chest pain with facial numbness, slurred words right arm pain  HPI: Emily Murray is a 66 y.o. female with medical history significant of coronary artery disease, hypertension, depression, bipolar disorder, anxiety and some chronic knee and back pain,  remote history of cocaine abuse    Presented with right arm/shoulder achiness radiating down the right arm associated with right facial numbness was worried that she could have trouble finding words. No associated headache. Last time seen normal at 2:30 PM stroke called at 7:16 PM initially was evaluated this code stroke undergone CT head and MRI which showed no acute findings.  Thursday she had pelvic pain she too azol and her lips started to feel numb. She stopped. She went to Urgent care UA was done. She was given 2 prescriptions. Her left pelvic area was still hurting and she went to Urgent care again on Saturday and was given pain medicine. She was directed to see urology she was Seen today had a UA and KUB showed no nephrolithiasis or UTI. 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  She describes burning pain in the lower back and pelvic area. She rols a pillow and that seems to help.  When she went home from urology she ate. She was not feeling good because her left side was hurting. She took a nap and while on the couch She noted that her right arm and shoulder was hurting and it was going down her arm. And her face felt numb.  She was scared she had a stroke. She drove her self to the hospital. She still feels like her cheek is numb.  She endorses recent multiple stressors she has a disabled sister and a grandchild she takes care of. Denies any fevers or chills  vomiting. No burning with  urination she did feel a bit nauseous. Was able to walk well.   She has been out of blood pressure medications Regarding pertinent Chronic problems: Reported history of coronary artery disease And HTN poorly controlled due to noncompliance.   IN ER:  No data recorded.      on arrival  ED Triage Vitals  Enc Vitals Group     BP 05/02/17 1930 127/74     Pulse Rate 05/02/17 2005 79     Resp 05/02/17 2005 19     Temp --      Temp src --      SpO2 05/02/17 1930 100 %     Weight 05/02/17 1900 163 lb 12.8 oz (74.3 kg)     Height --      Head Circumference --      Peak Flow --      Pain Score --      Pain Loc --      Pain Edu? --      Excl. in [place]? --   RR 17 100% HR 76 BP 126/81 Na 139 K 3.8 Cr 0.8 Hg 14.3 Trop 0.01 INR 0.96 WBC 11.2 Hg 13.2  plt 255  CT head non acute MRI - non acute Following Medications were ordered in ER: Medications - No data to display   ER provider discussed case with:  neurohospitalist  Hospitalist was called for admission for Chest pain  evaluation  Review of Systems:    Pertinent positives include:  chest pain, joint pain depression or anxiety. localizing neurological complaints slurred speech, numbness of the face  Constitutional:  No weight loss, night sweats, Fevers, chills, fatigue, weight loss  HEENT:  No headaches, Difficulty swallowing,Tooth/dental problems,Sore throat,  No sneezing, itching, ear ache, nasal congestion, post nasal drip,  Cardio-vascular:  No Orthopnea, PND, anasarca, dizziness, palpitations.no Bilateral lower extremity swelling  GI:  No heartburn, indigestion, abdominal pain, nausea, vomiting, diarrhea, change in bowel habits, loss of appetite, melena, blood in stool, hematemesis Resp:  no shortness of breath at rest. No dyspnea on exertion, No excess mucus, no productive cough, No non-productive cough, No coughing up of blood.No change in color of mucus.No wheezing. Skin:  no rash or lesions. No jaundice GU:  no  dysuria, change in color of urine, no urgency or frequency. No straining to urinate.  No flank pain.  Musculoskeletal:  [place] no joint swelling. No decreased range of motion. No back pain.  Psych:  No change in mood or affect. No  No memory loss.  Neuro: no, no tingling, no weakness, no double vision, no gait abnormality, no no confusion  As per HPI otherwise 10 point review of systems negative.   Past Medical History: Past Medical History:  Diagnosis Date  . Anxiety   . Bipolar 1 disorder ([place])   . Coronary artery disease   . Depressed   . Hypertension    Past Surgical History:  Procedure Laterality Date  . ABDOMINAL HYSTERECTOMY       Social History:  Ambulatory   independently       reports that she has never smoked. She has never used smokeless tobacco. She reports that she does not drink alcohol or use drugs.  Allergies:   Allergies  Allergen Reactions  . Cranberry Other (See Comments)    Patient took Cranberry tablets and her lips "went numb"     Family History:   Family History  Problem Relation Age of Onset  . Hypertension Mother   . Diabetes Neg Hx   . Cancer Neg Hx   . CAD Neg Hx     Medications: Prior to Admission medications   Medication Sig Start Date End Date Taking? Authorizing Provider  aspirin EC 325 MG tablet Take 325 mg by mouth once as needed (for arm or chest pain).   Yes [provider]  clonazePAM (KLONOPIN) 0.5 MG tablet Take 0.5 mg by mouth daily as needed for anxiety. For aneixty    Yes [provider]  estrogens, conjugated, (PREMARIN) 0.625 MG tablet Take 0.625 mg by mouth daily. Take daily for 21 days then do not take for 7 days.   Yes [provider]  eszopiclone (LUNESTA) 2 MG TABS tablet Take 2 mg by mouth at bedtime as needed.   Yes [provider]  folic acid (FOLVITE) 1 MG tablet Take 1 mg by mouth daily.   Yes [provider]  HYDROcodone-acetaminophen (NORCO/VICODIN) 5-325 MG  tablet Take 0.5-1 tablets by mouth every morning.    Yes [provider]  lamoTRIgine (LAMICTAL) 100 MG tablet Take 100 mg by mouth every morning.   Yes [provider]  nitrofurantoin, macrocrystal-monohydrate, (MACROBID) 100 MG capsule Take 100 mg by mouth every 12 (twelve) hours. For 10 days   Yes [provider]  sertraline (ZOLOFT) 100 MG tablet Take 200 mg by mouth daily after breakfast.     Yes [provider]  tamsulosin (FLOMAX) 0.4 MG CAPS capsule Take 0.4 mg by mouth daily with breakfast.   Yes [provider]  Vitamin D, Ergocalciferol, (DRISDOL) 50000 UNITS CAPS capsule Take 50,000 Units by mouth every 7 (seven) days.   Yes [provider]  cyclobenzaprine (FLEXERIL) 10 MG tablet Take 0.5 tablets (5 mg total) by mouth 2 (two) times daily as needed for muscle spasms. Patient not taking: Reported on 05/02/2017 07/11/13   [name], PA-C  naproxen sodium (ANAPROX) 220 MG tablet Take 660 mg by mouth 2 (two) times daily as needed (for pain).    [provider]  olmesartan-hydrochlorothiazide (BENICAR HCT) 40-12.5 MG per tablet Take 1 tablet by mouth daily.      [provider]  oxyCODONE-acetaminophen (PERCOCET/ROXICET) 5-325 MG per tablet Take 1 tablet by mouth every 6 (six) hours as needed for severe pain. Patient not taking: Reported on 05/02/2017 08/02/14   [name], NP    Physical Exam: Patient Vitals for the past 24 hrs:  BP Pulse Resp SpO2 Weight  05/02/17 2115 (!) 143/88 73 20 100 % -  05/02/17 2103 (!) 149/76 75 - 100 % -  05/02/17 2031 139/83 73 15 97 % -  05/02/17 2010 - 78 (!) 24 100 % -  05/02/17 2005 - 79 19 100 % -  05/02/17 1930 127/74 - - 100 % -  05/02/17 1900 - - - - 74.3 kg (163 lb 12.8 oz)    1. General:  in No Acute distress 2. Psychological: Alert and   Oriented 3. Head/ENT:     Dry Mucous Membranes                          Head Non traumatic, neck supple                            Poor Dentition 4. SKIN:   decreased Skin turgor,  Skin clean Dry and intact no rash 5. Heart: Regular rate and rhythm no  Murmur, Rub or gallop 6. Lungs: , no wheezes or crackles   7. Abdomen: Soft, non-tender, Non distended 8. Lower extremities: no clubbing, cyanosis, or edema 9. Neurologically  strength 5 out of 5 in all 4 extremities cranial nerves II through XII intact 10. MSK: Normal range of motion Shoulder and back pain reproducible palpation  body mass index is unknown because there is no height or weight on file.  Labs on Admission:   Labs on Admission: I have personally reviewed following labs and imaging studies  CBC:  Recent Labs Lab 05/02/17 1905 05/02/17 1916  WBC 11.2*  --   NEUTROABS 9.8*  --   HGB 13.2 14.3  HCT 39.6 42.0  MCV 81.3  --   PLT 255  --    Basic Metabolic Panel:  Recent Labs Lab 05/02/17 1905 05/02/17 1916  NA 135 139  K 3.6 3.8  CL 104 104  CO2 23  --   GLUCOSE 118* 116*  BUN 14 16  CREATININE 0.88 0.80  CALCIUM 9.0  --    GFR: CrCl cannot be calculated (Unknown ideal weight.). Liver Function Tests:  Recent Labs Lab 05/02/17 1905  AST 57*  ALT 25  ALKPHOS 102  BILITOT 0.2*  PROT 5.8*  ALBUMIN 3.5   No results for input(s): LIPASE, AMYLASE in the last 168 hours. No results for input(s): AMMONIA in the last 168 hours. Coagulation Profile:  Recent Labs Lab 05/02/17 1905  INR 0.96   Cardiac Enzymes: No results for input(s): CKTOTAL, CKMB, CKMBINDEX, TROPONINI in the last 168 hours. BNP (last 3 results) No results for input(s): PROBNP in the last 8760 hours. HbA1C: No results for input(s): HGBA1C in the last 72 hours. CBG:  Recent Labs Lab 05/02/17 1908  GLUCAP 119*   Lipid Profile: No results for input(s): CHOL, HDL, LDLCALC, TRIG, CHOLHDL, LDLDIRECT in the last 72 hours. Thyroid Function Tests: No results for input(s): TSH, T4TOTAL, FREET4, T3FREE, THYROIDAB in the last 72 hours. Anemia Panel: No results  for input(s): VITAMINB12, FOLATE, FERRITIN, TIBC, IRON, RETICCTPCT in the last 72 hours. Urine analysis:    Component Value Date/Time   COLORURINE YELLOW 05/02/2017 1953   APPEARANCEUR CLEAR 05/02/2017 1953   LABSPEC 1.017 05/02/2017 1953   PHURINE 6.0 05/02/2017 1953   GLUCOSEU NEGATIVE 05/02/2017 1953   HGBUR NEGATIVE 05/02/2017 1953   BILIRUBINUR NEGATIVE 05/02/2017 1953   KETONESUR NEGATIVE 05/02/2017 1953   PROTEINUR NEGATIVE 05/02/2017 1953   UROBILINOGEN 0.2 08/02/2014 1555   NITRITE NEGATIVE 05/02/2017 1953   LEUKOCYTESUR NEGATIVE 05/02/2017 1953   Sepsis Labs: @LABRCNTIP (procalcitonin:4,lacticidven:4) )No results found for this or any previous visit (from the past 240 hour(s)).    UA   no evidence of UTI     No results found for: HGBA1C  CrCl cannot be calculated (Unknown ideal weight.).  BNP (last 3 results) No results for input(s): PROBNP in the last 8760 hours.   ECG REPORT  Independently reviewed Rate: 79  Rhythm: NSR with Biatrial enlargement ST&T Change: ST flattening QTC  600  Filed Weights   05/02/17 1900  Weight: 74.3 kg (163 lb 12.8 oz)     Cultures:    Component Value Date/Time   SDES URINE, CLEAN CATCH 07/11/2013 1634   SPECREQUEST NONE 07/11/2013 1634   CULT NO GROWTH Performed at [facility] 07/11/2013 1634   REPTSTATUS 07/12/2013 FINAL 07/11/2013 1634     Radiological Exams on Admission: Mr Brain Limited Wo Contrast  Result Date: 05/02/2017 CLINICAL DATA:  66 y/o F; TIA, initial examination. Right face and right arm numbness with tingling. EXAM: MRI HEAD WITHOUT CONTRAST TECHNIQUE: Axial DWI, coronal DWI, axial T2 PROPELLER, and axial T2 FLAIR sequences were obtained. COMPARISON:  05/02/2017 CT head FINDINGS: Brain: No acute infarction, hemorrhage, hydrocephalus, extra-axial collection or mass lesion. Few scattered nonspecific foci of T2 FLAIR hyperintense signal abnormality in subcortical and periventricular white matter as well  as the pons is compatible with mild chronic microvascular ischemic changes for age. Mild brain parenchymal volume loss. Vascular: Normal flow voids. Skull and upper cervical spine: Normal marrow signal. Sinuses/Orbits: Negative. Other: None. IMPRESSION: 1. No acute intracranial abnormality identified. 2. Mild chronic microvascular ischemic changes and mild parenchymal volume loss of the brain. Electronically Signed   By: Kristine Garbe M.D.   On: 05/02/2017 21:01   Ct Head Code Stroke Wo Contrast  Result Date: 05/02/2017 CLINICAL DATA:  Code stroke. 66 y/o F; right-sided tingling, right-sided facial droop, aphasia. EXAM: CT HEAD WITHOUT CONTRAST TECHNIQUE: Contiguous axial images were obtained from the base of the skull through the vertex without intravenous contrast. COMPARISON:  08/23/2011 CT head FINDINGS: Brain: No evidence of acute infarction, hemorrhage, hydrocephalus, extra-axial collection or mass lesion/mass effect. Vascular: No hyperdense vessel identified. Mild calcific atherosclerosis of carotid siphons. Skull: Normal. Negative for fracture or focal lesion. Sinuses/Orbits: No acute finding. Other: None. ASPECTS [facility] Stroke Program Early CT Score) - Ganglionic level infarction (  caudate, lentiform nuclei, internal capsule, insula, M1-M3 cortex): 7 - Supraganglionic infarction (M4-M6 cortex): 3 Total score (0-10 with 10 being normal): 10 IMPRESSION: 1. No acute intracranial abnormality identified. 2. ASPECTS is 10. These results were called by telephone at the time of interpretation on 05/02/2017 at 7:32 pm to Dr. Zenovia Jarred , who verbally acknowledged these results. Electronically Signed   By: Kristine Garbe M.D.   On: 05/02/2017 19:33    Chart has been reviewed    Assessment/Plan   66 y.o. female with medical history significant of coronary artery disease, hypertension, depression, bipolar disorder, anxiety and some chronic knee and back pain,  remote history of  cocaine abuse Admitted for atypical chest pain   Present on Admission: . Chest pain- typical suspect musculoskeletal but given risk factors submit monitor telemetry cycle cardiac enzymes. Obtain echo given supportive control hypertension and abnormal EKG . Essential hypertension - restart home medications . Prolonged QT interval -  will monitor on tele avoid QT prolonging medications, rehydrate correct electrolytes Facial numbness MRI negative for any evidence of CVA currently improving appreciate neurology input . Bipolar 1 disorder ([place]) - continue home medications,  . Left flank pain - appears to be likely musculoskeletal   Other plan as per orders.  DVT prophylaxis: Lovenox     Code Status:  FULL CODE as per patient    Family Communication:   Family not at  Bedside    Disposition Plan:   To home once workup is complete and patient is stable                         Consults called: Neuroloy aware  Admission status:     obs   Level of care     tele           I have spent a total of 56 min on this admission  Livi Mcgann 05/02/2017, 11:00 PM   [facility]  Pager [phone]   after 2 AM please page floor coverage PA If 7AM-7PM, please contact the day team taking care of the patient  [email or web address]  Password [email or web address]

## 2017-05-02 NOTE — ED Triage Notes (Signed)
Pt c/o numbness/tingling to right face and down right arm, pt is having difficulty finding words, no slurred speech, denies headache.

## 2017-05-02 NOTE — ED Provider Notes (Signed)
[facility] DEPT Provider Note   CSN: [id number] Arrival date & time: 05/02/17  1821     History   Chief Complaint Chief Complaint  Patient presents with  . Numbness    HPI Emily Murray is a 66 y.o. female.  Patient with acute onset at 64 of some right anterior chest pain described as a discomfort that radiated into her right arm. Associated with numbness. Patient also had right-sided facial numbness. Patient never had any like this happen before. Patient was concerned about stroke she took a full aspirin at home. Patient has a history of hypertension.  Patient from triage was made a code stroke. Patient went immediately to head CT and then was brought back here for evaluation. Patient seen by the neuro hospitalist team.      Past Medical History:  Diagnosis Date  . Anxiety   . Bipolar 1 disorder ([place])   . Coronary artery disease   . Depressed   . Hypertension     Patient Active Problem List   Diagnosis Date Noted  . Chest pain 05/02/2017  . CAD (coronary artery disease) 05/02/2017  . Essential hypertension 05/02/2017    Past Surgical History:  Procedure Laterality Date  . ABDOMINAL HYSTERECTOMY      OB History    No data available       Home Medications    Prior to Admission medications   Medication Sig Start Date End Date Taking? Authorizing Provider  aspirin EC 325 MG tablet Take 325 mg by mouth once as needed (for arm or chest pain).   Yes [provider]  clonazePAM (KLONOPIN) 0.5 MG tablet Take 0.5 mg by mouth daily as needed for anxiety. For aneixty    Yes [provider]  estrogens, conjugated, (PREMARIN) 0.625 MG tablet Take 0.625 mg by mouth daily. Take daily for 21 days then do not take for 7 days.   Yes [provider]  eszopiclone (LUNESTA) 2 MG TABS tablet Take 2 mg by mouth at bedtime as needed.   Yes [provider]  folic acid (FOLVITE) 1 MG tablet Take 1 mg by mouth daily.   Yes [provider]  HYDROcodone-acetaminophen (NORCO/VICODIN) 5-325 MG tablet Take 0.5-1 tablets by mouth every morning.    Yes [provider]  lamoTRIgine (LAMICTAL) 100 MG tablet Take 100 mg by mouth every morning.   Yes [provider]  levothyroxine (SYNTHROID, LEVOTHROID) 150 MCG tablet Take 150 mcg by mouth daily before breakfast.   Yes [provider]  nitrofurantoin, macrocrystal-monohydrate, (MACROBID) 100 MG capsule Take 100 mg by mouth every 12 (twelve) hours. For 10 days   Yes [provider]  sertraline (ZOLOFT) 100 MG tablet Take 200 mg by mouth daily after breakfast.     Yes [provider]  tamsulosin (FLOMAX) 0.4 MG CAPS capsule Take 0.4 mg by mouth daily with breakfast.   Yes [provider]  Vitamin D, Ergocalciferol, (DRISDOL) 50000 UNITS CAPS capsule Take 50,000 Units by mouth every 7 (seven) days.   Yes [provider]  cyclobenzaprine (FLEXERIL) 10 MG tablet Take 0.5 tablets (5 mg total) by mouth 2 (two) times daily as needed for muscle spasms. Patient not taking: Reported on 05/02/2017 07/11/13   [name], PA-C  naproxen sodium (ANAPROX) 220 MG tablet Take 660 mg by mouth 2 (two) times daily as needed (for pain).    [provider]  olmesartan-hydrochlorothiazide (BENICAR HCT) 40-12.5 MG per tablet Take 1 tablet by mouth  daily.      [provider]  oxyCODONE-acetaminophen (PERCOCET/ROXICET) 5-325 MG per tablet Take 1 tablet by mouth every 6 (six) hours as needed for severe pain. Patient not taking: Reported on 05/02/2017 08/02/14   [name], NP    Family History No family history on file.  Social History Social History  Substance Use Topics  . Smoking status: Never Smoker  . Smokeless tobacco: Never Used  . Alcohol use No     Allergies   Cranberry   Review of Systems Review of Systems  Constitutional: Negative for fever.  HENT: Negative for congestion.   Eyes: Negative for  visual disturbance.  Respiratory: Negative for shortness of breath.   Cardiovascular: Positive for chest pain.  Gastrointestinal: Negative for abdominal pain.  Genitourinary: Negative for dysuria.  Musculoskeletal: Negative for back pain.  Skin: Negative for rash.  Neurological: Positive for numbness. Negative for speech difficulty and headaches.  Hematological: Does not bruise/bleed easily.  Psychiatric/Behavioral: Negative for confusion.     Physical Exam Updated Vital Signs BP 126/81   Pulse 76   Resp 17   Wt 74.3 kg (163 lb 12.8 oz)   SpO2 100%   Physical Exam  Constitutional: She is oriented to person, place, and time. She appears well-developed and well-nourished. She appears distressed.  HENT:  Head: Normocephalic and atraumatic.  Mouth/Throat: Oropharynx is clear and moist.  Eyes: Pupils are equal, round, and reactive to light. Conjunctivae and EOM are normal.  Neck: Normal range of motion. Neck supple.  Cardiovascular: Normal rate, regular rhythm and normal heart sounds.   Pulmonary/Chest: Effort normal and breath sounds normal.  Abdominal: Bowel sounds are normal.  Musculoskeletal: Normal range of motion.  Neurological: She is alert and oriented to person, place, and time. A cranial nerve deficit and sensory deficit is present.  Skin: Skin is warm. No rash noted.  Nursing note and vitals reviewed.    ED Treatments / Results  Labs (all labs ordered are listed, but only abnormal results are displayed) Labs Reviewed  CBC - Abnormal; Notable for the following:       Result Value   WBC 11.2 (*)    All other components within normal limits  DIFFERENTIAL - Abnormal; Notable for the following:    Neutro Abs 9.8 (*)    Lymphs Abs 0.6 (*)    All other components within normal limits  COMPREHENSIVE METABOLIC PANEL - Abnormal; Notable for the following:    Glucose, Bld 118 (*)    Total Protein 5.8 (*)    AST 57 (*)    Total Bilirubin 0.2 (*)    All other  components within normal limits  CBG MONITORING, ED - Abnormal; Notable for the following:    Glucose-Capillary 119 (*)    All other components within normal limits  I-STAT CHEM 8, ED - Abnormal; Notable for the following:    Glucose, Bld 116 (*)    All other components within normal limits  PROTIME-INR  APTT  URINALYSIS, ROUTINE W REFLEX MICROSCOPIC  TROPONIN I  TROPONIN I  TROPONIN I  I-STAT TROPONIN, ED    EKG  EKG Interpretation  Date/Time:  Monday May 02 2017 18:58:02 EDT Ventricular Rate:  79 PR Interval:  160 QRS Duration: 80 QT Interval:  374 QTC Calculation: 428 R Axis:   55 Text Interpretation:  Normal sinus rhythm Right atrial enlargement T wave abnormality, consider inferior ischemia Abnormal ECG Artifact Confirmed by Fredia Sorrow [phone]) on 05/02/2017 7:55:07 PM  Radiology Mr Brain Limited Wo Contrast  Result Date: 05/02/2017 CLINICAL DATA:  66 y/o F; TIA, initial examination. Right face and right arm numbness with tingling. EXAM: MRI HEAD WITHOUT CONTRAST TECHNIQUE: Axial DWI, coronal DWI, axial T2 PROPELLER, and axial T2 FLAIR sequences were obtained. COMPARISON:  05/02/2017 CT head FINDINGS: Brain: No acute infarction, hemorrhage, hydrocephalus, extra-axial collection or mass lesion. Few scattered nonspecific foci of T2 FLAIR hyperintense signal abnormality in subcortical and periventricular white matter as well as the pons is compatible with mild chronic microvascular ischemic changes for age. Mild brain parenchymal volume loss. Vascular: Normal flow voids. Skull and upper cervical spine: Normal marrow signal. Sinuses/Orbits: Negative. Other: None. IMPRESSION: 1. No acute intracranial abnormality identified. 2. Mild chronic microvascular ischemic changes and mild parenchymal volume loss of the brain. Electronically Signed   By: Kristine Garbe M.D.   On: 05/02/2017 21:01   Ct Head Code Stroke Wo Contrast  Result Date: 05/02/2017 CLINICAL DATA:   Code stroke. 66 y/o F; right-sided tingling, right-sided facial droop, aphasia. EXAM: CT HEAD WITHOUT CONTRAST TECHNIQUE: Contiguous axial images were obtained from the base of the skull through the vertex without intravenous contrast. COMPARISON:  08/23/2011 CT head FINDINGS: Brain: No evidence of acute infarction, hemorrhage, hydrocephalus, extra-axial collection or mass lesion/mass effect. Vascular: No hyperdense vessel identified. Mild calcific atherosclerosis of carotid siphons. Skull: Normal. Negative for fracture or focal lesion. Sinuses/Orbits: No acute finding. Other: None. ASPECTS [facility] Stroke Program Early CT Score) - Ganglionic level infarction (caudate, lentiform nuclei, internal capsule, insula, M1-M3 cortex): 7 - Supraganglionic infarction (M4-M6 cortex): 3 Total score (0-10 with 10 being normal): 10 IMPRESSION: 1. No acute intracranial abnormality identified. 2. ASPECTS is 10. These results were called by telephone at the time of interpretation on 05/02/2017 at 7:32 pm to Dr. Zenovia Jarred , who verbally acknowledged these results. Electronically Signed   By: Kristine Garbe M.D.   On: 05/02/2017 19:33    Procedures Procedures (including critical care time)   CRITICAL CARE Performed by: Fredia Sorrow Total critical care time: 30 minutes Critical care time was exclusive of separately billable procedures and treating other patients. Critical care was necessary to treat or prevent imminent or life-threatening deterioration. Critical care was time spent personally by me on the following activities: development of treatment plan with patient and/or surrogate as well as nursing, discussions with consultants, evaluation of patient's response to treatment, examination of patient, obtaining history from patient or surrogate, ordering and performing treatments and interventions, ordering and review of laboratory studies, ordering and review of radiographic studies, pulse oximetry  and re-evaluation of patient's condition.  Medications Ordered in ED Medications - No data to display   Initial Impression / Assessment and Plan / ED Course  I have reviewed the triage vital signs and the nursing notes.  Pertinent labs & imaging results that were available during my care of the patient were reviewed by me and considered in my medical decision making (see chart for details).     Patient arrived as a code stroke from triage. Patient at 1530 with onset of right facial numbness some right arm numbness, and seemed to have difficulty finding words. No slurred speech. No headache. Code stroke was initiated from triage. Head CT was negative. Patient seen by the neuro hospitalist. MRI ordered MRI shows no evidence of acute stroke. However when he got the patient's story that really part of the component at 1530 this afternoon was that there was discomfort in the right anterior chest that  radiated into her right arm. Has improved but it is still present and it has persisted. Patient never had anything like this before. Patient did take a full adult aspirin at home prior to coming in.  Patient's initial NIH score by neurology was 1. Patient not a candidate for TPA.  Patient MRI negative for acute stroke as mentioned. But the chest pain complaint warrants chest pain rule out admission. Patient is unassigned patient. Patient will be admitted by the hospitalist.  Final Clinical Impressions(s) / ED Diagnoses   Final diagnoses:  Cerebrovascular accident (CVA), unspecified mechanism ([place])  Precordial pain    New Prescriptions New Prescriptions   No medications on file     Fredia Sorrow, MD 05/02/17 2200

## 2017-05-02 NOTE — ED Notes (Signed)
Patient taken to Pod A to be evaluated by Dr. Thomasene Lot for stroke symptoms (slurring of words, R arm numbness per patient). Patient states her arm does not really feel numb after speaking with MD and states she feels aching pain in her shoulder instead. No Code Stroke called at this time per MD - patient taken to triage to be checked in.

## 2017-05-02 NOTE — ED Notes (Signed)
Pt in MRI with RN on full cardiac monitoring

## 2017-05-02 NOTE — ED Notes (Signed)
Pt to E48 from CT with RN and Neuro [facility]

## 2017-05-02 NOTE — ED Notes (Signed)
Pt requesting something to eat.  Turkey sandwich given 

## 2017-05-02 NOTE — Consult Note (Signed)
Neurology Consultation  Reason for Consult: Code Stroke Referring Physician: Dr. Venita Sheffield  CC: Right face numbness, right arm numbness, left arm numbness, right leg numbness.  History is obtained from: Patient  HPI: Emily Murray is a 66 y.o. female who has a past medical history of coronary artery disease, hypertension, depression, bipolar disorder, anxiety and some chronic knee and back pain, who presents to the hospital for evaluation of numbness on the right side of her face, numbness on the right arm that had progressed to become numbness on the left arm and some numbness on the right leg as well. According to her, she was at her doctor's appointment around 1 PM today and upon returning, did not feel right. She tried to take a nap but was uncomfortable as she felt that her right arm felt "not right". She described it as feeling numb. She described her facial sensation is being the carina feeling that she'll get after you get a local anesthetic at the dentist's. The symptoms and go away. Her last known normal was sometime between 2:30 and 3:30 PM. Code stroke was placed out at 7:16 PM. Patient was examined in the CT scanner at 7:20 PM. On initial exam, the patient's NIH stroke scale was 1- subjective sensory deficits on the right side of the body. I discussed risks and benefits of IV TPA with her including 6% chance of bleed, and it was decided that because of the nondisabling nature of the symptoms, not to use IV TPA. She also acknowledged history of migraines in the past, but hasn't had a migraine in many years. She was on Zomig for migraine prevention at some point. She denies any current headaches with this episode. She acknowledged multiple personal stressors including having to take care of her disabled sister and a young grandchild by herself. She at one point during today's encounter, triage, had said that she has been having some word finding difficulty that she noticed upon arrival  at the hospital. She drove herself to the hospital, and had not called 911.  Denies any fevers, chills, loss of appetite or weight, ?chest pain, no shortness of breath, nausea, vomiting. Reports left flank pain, which for she has been evaluated by the primary care and a UTI has been ruled out and she has been given hydrocodone for possible joint pain.  LKW: 2:30 PM to 3:30 PM. tpa given?: no, low NIH stroke scale, unsure if symptoms are consistent with a stroke Premorbid modified Rankin scale (mRS): 0   ROS: A 14 point ROS was performed and is negative except as noted in the HPI.    Past Medical History:  Diagnosis Date  . Anxiety   . Bipolar 1 disorder ([place])   . Coronary artery disease   . Depressed   . Hypertension     No family history on file.   Social History:   reports that she has never smoked. She has never used smokeless tobacco. She reports that she does not drink alcohol or use drugs.  Medication list below. Reports noncompliance to aspirin as well as Benicar HCT.  No current facility-administered medications for this encounter.   Current Outpatient Prescriptions:  .  aspirin 81 MG tablet, Take 81 mg by mouth daily., Disp: , Rfl:  .  clonazePAM (KLONOPIN) 0.5 MG tablet, Take 0.5 mg by mouth 2 (two) times daily as needed. For aneixty , Disp: , Rfl:  .  cyclobenzaprine (FLEXERIL) 10 MG tablet, Take 0.5 tablets (5 mg total) by  mouth 2 (two) times daily as needed for muscle spasms., Disp: 20 tablet, Rfl: 0 .  Estrogens Conjugated (PREMARIN PO), Take 1 tablet by mouth daily., Disp: , Rfl:  .  estrogens conjugated, synthetic A, (CENESTIN) 0.625 MG tablet, Take 0.625 mg by mouth daily.  , Disp: , Rfl:  .  folic acid (FOLVITE) 1 MG tablet, Take 1 mg by mouth daily., Disp: , Rfl:  .  Levothyroxine Sodium (SYNTHROID PO), Take 1 tablet by mouth daily., Disp: , Rfl:  .  naproxen sodium (ANAPROX) 220 MG tablet, Take 660 mg by mouth 2 (two) times daily as needed (for pain)., Disp:  , Rfl:  .  olmesartan-hydrochlorothiazide (BENICAR HCT) 40-12.5 MG per tablet, Take 1 tablet by mouth daily.  , Disp: , Rfl:  .  oxyCODONE-acetaminophen (PERCOCET/ROXICET) 5-325 MG per tablet, Take 1 tablet by mouth every 6 (six) hours as needed for severe pain., Disp: 15 tablet, Rfl: 0 .  phenazopyridine (PYRIDIUM) 200 MG tablet, Take 200 mg by mouth 3 (three) times daily as needed for pain., Disp: , Rfl:  .  sertraline (ZOLOFT) 100 MG tablet, Take 200 mg by mouth daily after breakfast.  , Disp: , Rfl:  .  sulfamethoxazole-trimethoprim (BACTRIM DS) 800-160 MG per tablet, Take 1 tablet by mouth 2 (two) times daily., Disp: , Rfl:  .  Vitamin D, Ergocalciferol, (DRISDOL) 50000 UNITS CAPS capsule, Take 50,000 Units by mouth every 7 (seven) days., Disp: , Rfl:   Exam: Current vital signs: BP 127/74   Wt 74.3 kg (163 lb 12.8 oz)   SpO2 100%  Vital signs in last 24 hours: BP: (127)/(74) 127/74 (08/06 1930) SpO2:  [100 %] 100 % (08/06 1930) Weight:  [74.3 kg (163 lb 12.8 oz)] 74.3 kg (163 lb 12.8 oz) (08/06 1900)  GENERAL: Awake, alert in NAD HEENT: - Normocephalic and atraumatic, dry mm, no LN++, no Thyromegally LUNGS - Clear to auscultation bilaterally with no wheezes CV - S1S2 RRR, no m/r/g, equal pulses bilaterally. ABDOMEN - Soft, nontender, nondistended with normoactive BS Ext: warm, well perfused, intact peripheral pulses,  No edema  NEURO:  Mental Status: AA&Ox3  Language: speech is clear with and without her dentures on.  Naming, repetition, fluency, and comprehension intact. Cranial Nerves: PERRL 3 mm/brisk. EOMI, visual fields full, face is symmetric, facial sensation intact, hearing intact, tongue/uvula/soft palate midline, normal sternocleidomastoid and trapezius muscle strength. No evidence of tongue atrophy or fibrillations Motor: 5/5 in all 4 extremities with normal tone, normal bulk, normal range of motion. Tone: is normal and bulk is normal Sensation- decreased to light  touch on the right leg compared to the left, decreased light touch on the arm on the right, decreased sensation on the face on the right. Coordination: FTN intact bilaterally, no ataxia in BLE. Gait- walks with a slight limp, attributes to the right knee back pain. Reports her gait is at her usual baseline.  NIHSS - 1 for sensory   Labs CBC    Component Value Date/Time   WBC 11.2 (H) 05/02/2017 1905   RBC 4.87 05/02/2017 1905   HGB 14.3 05/02/2017 1916   HCT 42.0 05/02/2017 1916   PLT 255 05/02/2017 1905   MCV 81.3 05/02/2017 1905   MCH 27.1 05/02/2017 1905   MCHC 33.3 05/02/2017 1905   RDW 15.4 05/02/2017 1905   LYMPHSABS 0.6 (L) 05/02/2017 1905   MONOABS 0.8 05/02/2017 1905   EOSABS 0.1 05/02/2017 1905   BASOSABS 0.0 05/02/2017 1905    CMP  Component Value Date/Time   NA 139 05/02/2017 1916   K 3.8 05/02/2017 1916   CL 104 05/02/2017 1916   CO2 25 08/02/2014 1537   GLUCOSE 116 (H) 05/02/2017 1916   BUN 16 05/02/2017 1916   CREATININE 0.80 05/02/2017 1916   CALCIUM 9.3 08/02/2014 1537   PROT 6.6 08/02/2014 1537   ALBUMIN 3.6 08/02/2014 1537   AST 23 08/02/2014 1537   ALT 14 08/02/2014 1537   ALKPHOS 82 08/02/2014 1537   BILITOT <0.2 (L) 08/02/2014 1537   GFRNONAA 69 (L) 08/02/2014 1537   GFRAA 80 (L) 08/02/2014 1537   Trop - negative first set POC  Imaging I have reviewed the images obtained: CT-scan of the brain - no acute abnormality. MRI examination of the brain - P   Assessment:  66 year old African-American woman with a past medical history of coronary artery disease, hypertension noncompliant with medications, anxiety and depression presents for evaluation of right-sided tingling and numbness that started about 4-5 hours prior to presentation to the hospital. On exam, her NIH stroke scale was low-only 1 point for subjective sensory deficits on the right. She had no motor, coordination, speech or language deficits. She was able to ambulate without  support. She has a history of migraines, but hasn't had migraines in a while and is currently dealing with some personal stressors. She's also been out of her antihypertensives. Her symptoms could be related to a small stroke, but her symptoms are not disabling and hence I did not pursue giving her IV TPA. It is more likely that her symptoms are probably related to hypertensive urgency, or anxiety or maybe even complex migraine. I am ordering a stat MRI without contrast of the brain to evaluate for stroke.   Impression: Right sided paresthesias Anxiety Complex migraine R/o stroke  Recommendations: -Stat MRI of the brain to evaluate for stroke - ordered in the EMR as well as spoke with the MRI technologist to expedite her MRI. -Check vitamin B12 levels, TSH. -Check a urinalysis. -Check urinary tox screen Further recommendations based on clinical course and imaging studies. We'll update recommendations as imaging studies become available.  -Emily Portland, MD [facility] [phone]   ADDENDUM post imaging 2100 hrs MRI brain (stroke limited protocol) performed.  Images reviewed by me. No evidence of acute stroke. Labs reveal mild leukocytosis and minimally deranged LFTs. No evidence of UTI. At this time, recs below: -Manage BP for goal less than 694 systolic -I discussed this with the patient but please discuss again the Importance of compliance with meds -Check U.tox as well as serum B12 and TSH.  -Correction of any metabolic derangements if found, per primary team. -Should follow up with outpatient neurology as needed or if symptoms persist. -Management of CP per ER/Medicine teams.  No further neurological recommendations at this time.  -- Emily Portland, MD [facility] [phone]

## 2017-05-02 NOTE — ED Notes (Signed)
RN provided graham crackers water

## 2017-05-03 ENCOUNTER — Encounter (HOSPITAL_COMMUNITY): Payer: Self-pay | Admitting: General Practice

## 2017-05-03 ENCOUNTER — Observation Stay (HOSPITAL_BASED_OUTPATIENT_CLINIC_OR_DEPARTMENT_OTHER): Payer: Medicare Other

## 2017-05-03 ENCOUNTER — Observation Stay (HOSPITAL_COMMUNITY): Payer: Medicare Other

## 2017-05-03 DIAGNOSIS — I361 Nonrheumatic tricuspid (valve) insufficiency: Secondary | ICD-10-CM | POA: Diagnosis not present

## 2017-05-03 DIAGNOSIS — Z91018 Allergy to other foods: Secondary | ICD-10-CM | POA: Diagnosis not present

## 2017-05-03 DIAGNOSIS — Z791 Long term (current) use of non-steroidal anti-inflammatories (NSAID): Secondary | ICD-10-CM | POA: Diagnosis not present

## 2017-05-03 DIAGNOSIS — Z7982 Long term (current) use of aspirin: Secondary | ICD-10-CM | POA: Diagnosis not present

## 2017-05-03 DIAGNOSIS — Z90710 Acquired absence of both cervix and uterus: Secondary | ICD-10-CM | POA: Diagnosis not present

## 2017-05-03 DIAGNOSIS — R109 Unspecified abdominal pain: Secondary | ICD-10-CM | POA: Diagnosis not present

## 2017-05-03 DIAGNOSIS — Z23 Encounter for immunization: Secondary | ICD-10-CM | POA: Diagnosis not present

## 2017-05-03 DIAGNOSIS — F419 Anxiety disorder, unspecified: Secondary | ICD-10-CM | POA: Diagnosis not present

## 2017-05-03 DIAGNOSIS — M5126 Other intervertebral disc displacement, lumbar region: Secondary | ICD-10-CM | POA: Diagnosis not present

## 2017-05-03 DIAGNOSIS — F319 Bipolar disorder, unspecified: Secondary | ICD-10-CM | POA: Diagnosis not present

## 2017-05-03 DIAGNOSIS — I10 Essential (primary) hypertension: Secondary | ICD-10-CM | POA: Diagnosis not present

## 2017-05-03 DIAGNOSIS — R402144 Coma scale, eyes open, spontaneous, 24 hours or more after hospital admission: Secondary | ICD-10-CM | POA: Diagnosis not present

## 2017-05-03 DIAGNOSIS — M48061 Spinal stenosis, lumbar region without neurogenic claudication: Secondary | ICD-10-CM | POA: Diagnosis not present

## 2017-05-03 DIAGNOSIS — R29701 NIHSS score 1: Secondary | ICD-10-CM | POA: Diagnosis not present

## 2017-05-03 DIAGNOSIS — I160 Hypertensive urgency: Secondary | ICD-10-CM | POA: Diagnosis not present

## 2017-05-03 DIAGNOSIS — I2510 Atherosclerotic heart disease of native coronary artery without angina pectoris: Secondary | ICD-10-CM | POA: Diagnosis not present

## 2017-05-03 LAB — COMPREHENSIVE METABOLIC PANEL
ALBUMIN: 3 g/dL — AB (ref 3.5–5.0)
ALK PHOS: 91 U/L (ref 38–126)
ALT: 26 U/L (ref 14–54)
ANION GAP: 7 (ref 5–15)
AST: 33 U/L (ref 15–41)
BILIRUBIN TOTAL: 0.3 mg/dL (ref 0.3–1.2)
BUN: 11 mg/dL (ref 6–20)
CALCIUM: 8.7 mg/dL — AB (ref 8.9–10.3)
CO2: 25 mmol/L (ref 22–32)
Chloride: 107 mmol/L (ref 101–111)
Creatinine, Ser: 0.75 mg/dL (ref 0.44–1.00)
GFR calc Af Amer: >60 mL/min (ref >60)
GFR calc non Af Amer: >60 mL/min (ref >60)
GLUCOSE: 115 mg/dL — AB (ref 65–99)
Potassium: 3.5 mmol/L (ref 3.5–5.1)
Sodium: 139 mmol/L (ref 135–145)
TOTAL PROTEIN: 5.5 g/dL — AB (ref 6.5–8.1)

## 2017-05-03 LAB — MAGNESIUM: MAGNESIUM: 1.8 mg/dL (ref 1.7–2.4)

## 2017-05-03 LAB — RAPID URINE DRUG SCREEN, HOSP PERFORMED
Amphetamines: NOT DETECTED
BARBITURATES: NOT DETECTED
BENZODIAZEPINES: NOT DETECTED
COCAINE: POSITIVE — AB
Opiates: POSITIVE — AB
TETRAHYDROCANNABINOL: NOT DETECTED

## 2017-05-03 LAB — CBC
HCT: 36.5 % (ref 36.0–46.0)
HEMOGLOBIN: 12.2 g/dL (ref 12.0–15.0)
MCH: 26.9 pg (ref 26.0–34.0)
MCHC: 33.4 g/dL (ref 30.0–36.0)
MCV: 80.6 fL (ref 78.0–100.0)
Platelets: 229 10*3/uL (ref 150–400)
RBC: 4.53 MIL/uL (ref 3.87–5.11)
RDW: 15.3 % (ref 11.5–15.5)
WBC: 8.6 10*3/uL (ref 4.0–10.5)

## 2017-05-03 LAB — PHOSPHORUS: Phosphorus: 3.3 mg/dL (ref 2.5–4.6)

## 2017-05-03 LAB — ECHOCARDIOGRAM COMPLETE
HEIGHTINCHES: 63 in
WEIGHTICAEL: 2608 [oz_av]

## 2017-05-03 LAB — TSH: TSH: 0.891 u[IU]/mL (ref 0.350–4.500)

## 2017-05-03 LAB — TROPONIN I: TROPONIN I: 0.04 ng/mL — AB (ref <0.03)

## 2017-05-03 MED ORDER — SODIUM CHLORIDE 0.9 % IV SOLN: 250.0000 mL | INTRAVENOUS | Status: DC | PRN

## 2017-05-03 MED ORDER — ENOXAPARIN SODIUM 40 MG/0.4ML ~~LOC~~ SOLN
40.0000 mg | SUBCUTANEOUS | Status: DC
  Administered 2017-05-03 – 2017-05-04 (×2): 40 mg via SUBCUTANEOUS
  Filled 2017-05-03 (×2): qty 0.4

## 2017-05-03 MED ORDER — TRAZODONE HCL 50 MG PO TABS
50.0000 mg | ORAL_TABLET | Freq: Once | ORAL | Status: AC
  Administered 2017-05-03: 50 mg via ORAL
  Filled 2017-05-03: qty 1

## 2017-05-03 MED ORDER — IRBESARTAN 150 MG PO TABS
300.0000 mg | ORAL_TABLET | Freq: Every day | ORAL | Status: DC
Start: 2017-05-03 — End: 2017-05-05
  Administered 2017-05-03 – 2017-05-05 (×3): 300 mg via ORAL
  Filled 2017-05-03 (×3): qty 2

## 2017-05-03 MED ORDER — SODIUM CHLORIDE 0.9% FLUSH: 3.0000 mL | INTRAVENOUS | Status: DC | PRN

## 2017-05-03 MED ORDER — PNEUMOCOCCAL VAC POLYVALENT 25 MCG/0.5ML IJ INJ
0.5000 mL | INJECTION | INTRAMUSCULAR | Status: AC
Start: 2017-05-04 — End: 2017-05-05
  Administered 2017-05-05: 0.5 mL via INTRAMUSCULAR
  Filled 2017-05-03: qty 0.5

## 2017-05-03 MED ORDER — SODIUM CHLORIDE 0.9% FLUSH
3.0000 mL | Freq: Two times a day (BID) | INTRAVENOUS | Status: DC
  Administered 2017-05-03 – 2017-05-05 (×5): 3 mL via INTRAVENOUS

## 2017-05-03 MED ORDER — SERTRALINE HCL 100 MG PO TABS
200.0000 mg | ORAL_TABLET | Freq: Every day | ORAL | Status: DC
  Administered 2017-05-03 – 2017-05-05 (×3): 200 mg via ORAL
  Filled 2017-05-03 (×3): qty 2

## 2017-05-03 MED ORDER — ACETAMINOPHEN 650 MG RE SUPP: 650.0000 mg | Freq: Four times a day (QID) | RECTAL | Status: DC | PRN

## 2017-05-03 MED ORDER — CYCLOBENZAPRINE HCL 10 MG PO TABS: 5.0000 mg | ORAL_TABLET | Freq: Three times a day (TID) | ORAL | Status: DC | PRN

## 2017-05-03 MED ORDER — ONDANSETRON HCL 4 MG/2ML IJ SOLN: 4.0000 mg | Freq: Four times a day (QID) | INTRAMUSCULAR | Status: DC | PRN

## 2017-05-03 MED ORDER — ASPIRIN EC 325 MG PO TBEC: 325.0000 mg | DELAYED_RELEASE_TABLET | Freq: Once | ORAL | Status: DC | PRN

## 2017-05-03 MED ORDER — ASPIRIN EC 81 MG PO TBEC
81.0000 mg | DELAYED_RELEASE_TABLET | Freq: Every day | ORAL | Status: DC
  Administered 2017-05-03 – 2017-05-05 (×3): 81 mg via ORAL
  Filled 2017-05-03 (×3): qty 1

## 2017-05-03 MED ORDER — LAMOTRIGINE 100 MG PO TABS
100.0000 mg | ORAL_TABLET | Freq: Every morning | ORAL | Status: DC
  Administered 2017-05-03 – 2017-05-05 (×3): 100 mg via ORAL
  Filled 2017-05-03 (×3): qty 1

## 2017-05-03 MED ORDER — ACETAMINOPHEN 325 MG PO TABS: 650.0000 mg | ORAL_TABLET | Freq: Four times a day (QID) | ORAL | Status: DC | PRN

## 2017-05-03 MED ORDER — ZOLPIDEM TARTRATE 5 MG PO TABS
5.0000 mg | ORAL_TABLET | Freq: Every evening | ORAL | Status: DC | PRN
  Administered 2017-05-04: 5 mg via ORAL
  Filled 2017-05-03: qty 1

## 2017-05-03 MED ORDER — ONDANSETRON HCL 4 MG PO TABS: 4.0000 mg | ORAL_TABLET | Freq: Four times a day (QID) | ORAL | Status: DC | PRN

## 2017-05-03 MED ORDER — HYDROCODONE-ACETAMINOPHEN 5-325 MG PO TABS
0.5000 | ORAL_TABLET | Freq: Four times a day (QID) | ORAL | Status: DC | PRN
  Administered 2017-05-03 – 2017-05-05 (×3): 1 via ORAL
  Filled 2017-05-03 (×3): qty 1

## 2017-05-03 MED ORDER — CLONAZEPAM 0.5 MG PO TABS
0.5000 mg | ORAL_TABLET | Freq: Every day | ORAL | Status: DC | PRN
  Administered 2017-05-03: 0.5 mg via ORAL
  Filled 2017-05-03: qty 1

## 2017-05-03 MED ORDER — LEVOTHYROXINE SODIUM 75 MCG PO TABS
150.0000 ug | ORAL_TABLET | Freq: Every day | ORAL | Status: DC
  Administered 2017-05-03 – 2017-05-05 (×3): 150 ug via ORAL
  Filled 2017-05-03 (×3): qty 2

## 2017-05-03 NOTE — Progress Notes (Signed)
PROGRESS NOTE    Emily Murray  UKG:[id number] DOB: [birth year] DOA: 05/02/2017 PCP: Vincente Liberty, MD    Brief Narrative:  Emily Murray is a 66 y.o. female with medical history significant of coronary artery disease, hypertension, depression, bipolar disorder, anxiety and some chronic knee and back pain,  remote history of cocaine abuse, comes in for right shoulder pain, left flank pain, right facial numbness which resolved spontaneously. She denies any chest pain. She underwent an MRI brain to evaluate for right facial and right sided lip numbness, which did not show any acute stroke. She is not sure if its from the pain medication. Neurology consulted in ED , recommended MRI and if negative, should follow up with outpatient neurology if symptoms re occur. TSH wnl. Vitamin b12 and urine tox pending.   Assessment & Plan:   Active Problems:   Chest pain   Essential hypertension   Prolonged QT interval   Bipolar 1 disorder (HCC)   Left flank pain   Left flank pain:  Unclear etiology. UA is negative. Differential include lumbar spondylosis vs musculoskeletal pain.  Pain control with vicodin and MRI of the lumbar spine ordered.    Hypertension: well controlled. On avapro.    Hypothyroidism:  TSH normal.  Resume synthroid.    Bipolar disorder: stable.  Continue with home meds.    Right lip and facial numbness:  MRI brain negative.  Neurology consulted and recommendations given.  tsh wnl.  Vit b12 levels pending.  Urine toxicology pending.      DVT prophylaxis: (Lovenox/) Code Status: full code.  Family Communication: (none at bedside.  Disposition Plan: pending MRI LS   Consultants:   None.    Procedures: MRI lumbar spine.  MRI head No acute intracranial abnormality identified. 2. Mild chronic microvascular ischemic changes and mild parenchymal  volume loss of the brain.   Antimicrobials: none.    Subjective: Left flank pain  occasionally.  Objective: Vitals:   05/03/17 0200 05/03/17 0413 05/03/17 0813 05/03/17 1018  BP: (!) 148/98 133/80  (!) 141/94  Pulse: 65 64 68 66  Resp:      Temp: 97.8 F (36.6 C) 98 F (36.7 C)  98.1 F (36.7 C)  TempSrc: Oral Oral  Oral  SpO2: 100% 100% 100% 100%  Weight:      Height:        Intake/Output Summary (Last 24 hours) at 05/03/17 1132 Last data filed at 05/03/17 0700  Gross per 24 hour  Intake                0 ml  Output                0 ml  Net                0 ml   Filed Weights   05/02/17 1900 05/03/17 0116  Weight: 74.3 kg (163 lb 12.8 oz) 73.9 kg (163 lb)    Examination:  General exam: Appears calm and comfortable  Respiratory system: Clear to auscultation. Respiratory effort normal. Cardiovascular system: S1 & S2 heard, RRR. No JVD, murmurs, rubs, gallops or clicks. No pedal edema. Gastrointestinal system: Abdomen is nondistended, soft and nontender. No organomegaly or masses felt. Normal bowel sounds heard. Central nervous system: Alert and oriented. No focal neurological deficits. Extremities: Symmetric 5 x 5 power. Skin: No rashes, lesions or ulcers Psychiatry: Judgement and insight appear normal. Mood & affect appropriate.     Data Reviewed: I have  personally reviewed following labs and imaging studies  CBC:  Recent Labs Lab 05/02/17 1905 05/02/17 1916 05/03/17 0323  WBC 11.2*  --  8.6  NEUTROABS 9.8*  --   --   HGB 13.2 14.3 12.2  HCT 39.6 42.0 36.5  MCV 81.3  --  80.6  PLT 255  --  220   Basic Metabolic Panel:  Recent Labs Lab 05/02/17 1905 05/02/17 1916 05/02/17 2225 05/03/17 0323  NA 135 139  --  139  K 3.6 3.8  --  3.5  CL 104 104  --  107  CO2 23  --   --  25  GLUCOSE 118* 116*  --  115*  BUN 14 16  --  11  CREATININE 0.88 0.80  --  0.75  CALCIUM 9.0  --   --  8.7*  MG  --   --  1.6* 1.8  PHOS  --   --  2.8 3.3   GFR: Estimated Creatinine Clearance: 64.8 mL/min (by C-G formula based on SCr of 0.75  mg/dL). Liver Function Tests:  Recent Labs Lab 05/02/17 1905 05/03/17 0323  AST 57* 33  ALT 25 26  ALKPHOS 102 91  BILITOT 0.2* 0.3  PROT 5.8* 5.5*  ALBUMIN 3.5 3.0*   No results for input(s): LIPASE, AMYLASE in the last 168 hours. No results for input(s): AMMONIA in the last 168 hours. Coagulation Profile:  Recent Labs Lab 05/02/17 1905  INR 0.96   Cardiac Enzymes:  Recent Labs Lab 05/02/17 2225 05/03/17 0323 05/03/17 0821  TROPONINI <0.03 <0.03 0.04*   BNP (last 3 results) No results for input(s): PROBNP in the last 8760 hours. HbA1C: No results for input(s): HGBA1C in the last 72 hours. CBG:  Recent Labs Lab 05/02/17 1908  GLUCAP 119*   Lipid Profile: No results for input(s): CHOL, HDL, LDLCALC, TRIG, CHOLHDL, LDLDIRECT in the last 72 hours. Thyroid Function Tests:  Recent Labs  05/03/17 0323  TSH 0.891   Anemia Panel: No results for input(s): VITAMINB12, FOLATE, FERRITIN, TIBC, IRON, RETICCTPCT in the last 72 hours. Sepsis Labs: No results for input(s): PROCALCITON, LATICACIDVEN in the last 168 hours.  No results found for this or any previous visit (from the past 240 hour(s)).       Radiology Studies: Mr Brain Limited Wo Contrast  Result Date: 05/02/2017 CLINICAL DATA:  66 y/o F; TIA, initial examination. Right face and right arm numbness with tingling. EXAM: MRI HEAD WITHOUT CONTRAST TECHNIQUE: Axial DWI, coronal DWI, axial T2 PROPELLER, and axial T2 FLAIR sequences were obtained. COMPARISON:  05/02/2017 CT head FINDINGS: Brain: No acute infarction, hemorrhage, hydrocephalus, extra-axial collection or mass lesion. Few scattered nonspecific foci of T2 FLAIR hyperintense signal abnormality in subcortical and periventricular white matter as well as the pons is compatible with mild chronic microvascular ischemic changes for age. Mild brain parenchymal volume loss. Vascular: Normal flow voids. Skull and upper cervical spine: Normal marrow signal.  Sinuses/Orbits: Negative. Other: None. IMPRESSION: 1. No acute intracranial abnormality identified. 2. Mild chronic microvascular ischemic changes and mild parenchymal volume loss of the brain. Electronically Signed   By: Kristine Garbe M.D.   On: 05/02/2017 21:01   Ct Head Code Stroke Wo Contrast  Result Date: 05/02/2017 CLINICAL DATA:  Code stroke. 66 y/o F; right-sided tingling, right-sided facial droop, aphasia. EXAM: CT HEAD WITHOUT CONTRAST TECHNIQUE: Contiguous axial images were obtained from the base of the skull through the vertex without intravenous contrast. COMPARISON:  08/23/2011 CT head FINDINGS: Brain:  No evidence of acute infarction, hemorrhage, hydrocephalus, extra-axial collection or mass lesion/mass effect. Vascular: No hyperdense vessel identified. Mild calcific atherosclerosis of carotid siphons. Skull: Normal. Negative for fracture or focal lesion. Sinuses/Orbits: No acute finding. Other: None. ASPECTS [facility] Stroke Program Early CT Score) - Ganglionic level infarction (caudate, lentiform nuclei, internal capsule, insula, M1-M3 cortex): 7 - Supraganglionic infarction (M4-M6 cortex): 3 Total score (0-10 with 10 being normal): 10 IMPRESSION: 1. No acute intracranial abnormality identified. 2. ASPECTS is 10. These results were called by telephone at the time of interpretation on 05/02/2017 at 7:32 pm to Dr. Zenovia Jarred , who verbally acknowledged these results. Electronically Signed   By: Kristine Garbe M.D.   On: 05/02/2017 19:33        Scheduled Meds: . aspirin EC  81 mg Oral Daily  . enoxaparin (LOVENOX) injection  40 mg Subcutaneous Q24H  . HYDROcodone-acetaminophen  0.5-1 tablet Oral q morning - 10a  . irbesartan  300 mg Oral Daily  . lamoTRIgine  100 mg Oral q morning - 10a  . levothyroxine  150 mcg Oral QAC breakfast  . [START ON 05/04/2017] pneumococcal 23 valent vaccine  0.5 mL Intramuscular Tomorrow-1000  . sertraline  200 mg Oral QPC breakfast    . sodium chloride flush  3 mL Intravenous Q12H   Continuous Infusions: . sodium chloride       LOS: 0 days    Time spent: 35  Minutes.     Hosie Poisson, MD [facility] Pager [phone]   If 7PM-7AM, please contact night-coverage [email or web address] Password [email or web address] 05/03/2017, 11:32 AM

## 2017-05-03 NOTE — Progress Notes (Signed)
:  CRITICAL VALUE ALERT  Critical Value:  Trop 0.04  Date & Time Notied:  05/03/2017 at 09:32  Provider Notified: Akula,MD  Orders Received/Actions taken: Awaiting new orders. Pt denies CP. VS stable & charted. Will continue to monitor the pt. Hoover Brunette, RN

## 2017-05-03 NOTE — Plan of Care (Signed)
Problem: Skin Integrity: Goal: Risk for impaired skin integrity will decrease Outcome: Completed/Met Date Met: 05/03/17 braden score > 18'; lovenox ordered for VTE

## 2017-05-03 NOTE — Plan of Care (Signed)
Problem: Education: Goal: Knowledge of [facility] Education information/materials will improve Outcome: Completed/Met Date Met: 05/03/17 Pt educated on the pain rating the scale, the importance of hand hygiene how hand off would work for staff, her plan of care & she was oriented to room, floor & equipment.

## 2017-05-03 NOTE — ED Notes (Signed)
Pt denies any chest pain at this time,  Pt resting at this time

## 2017-05-04 DIAGNOSIS — I25110 Atherosclerotic heart disease of native coronary artery with unstable angina pectoris: Secondary | ICD-10-CM | POA: Diagnosis not present

## 2017-05-04 DIAGNOSIS — Z91018 Allergy to other foods: Secondary | ICD-10-CM | POA: Diagnosis not present

## 2017-05-04 DIAGNOSIS — I160 Hypertensive urgency: Secondary | ICD-10-CM | POA: Diagnosis present

## 2017-05-04 DIAGNOSIS — R072 Precordial pain: Secondary | ICD-10-CM | POA: Diagnosis not present

## 2017-05-04 DIAGNOSIS — R109 Unspecified abdominal pain: Secondary | ICD-10-CM | POA: Diagnosis not present

## 2017-05-04 DIAGNOSIS — F319 Bipolar disorder, unspecified: Secondary | ICD-10-CM | POA: Diagnosis not present

## 2017-05-04 DIAGNOSIS — R079 Chest pain, unspecified: Secondary | ICD-10-CM | POA: Diagnosis not present

## 2017-05-04 DIAGNOSIS — Z791 Long term (current) use of non-steroidal anti-inflammatories (NSAID): Secondary | ICD-10-CM | POA: Diagnosis not present

## 2017-05-04 DIAGNOSIS — Z7982 Long term (current) use of aspirin: Secondary | ICD-10-CM | POA: Diagnosis not present

## 2017-05-04 DIAGNOSIS — Z9119 Patient's noncompliance with other medical treatment and regimen: Secondary | ICD-10-CM | POA: Diagnosis not present

## 2017-05-04 DIAGNOSIS — I10 Essential (primary) hypertension: Secondary | ICD-10-CM | POA: Diagnosis not present

## 2017-05-04 DIAGNOSIS — I4581 Long QT syndrome: Secondary | ICD-10-CM | POA: Diagnosis present

## 2017-05-04 DIAGNOSIS — I2510 Atherosclerotic heart disease of native coronary artery without angina pectoris: Secondary | ICD-10-CM | POA: Diagnosis present

## 2017-05-04 DIAGNOSIS — G43109 Migraine with aura, not intractable, without status migrainosus: Secondary | ICD-10-CM | POA: Diagnosis present

## 2017-05-04 DIAGNOSIS — F419 Anxiety disorder, unspecified: Secondary | ICD-10-CM | POA: Diagnosis present

## 2017-05-04 DIAGNOSIS — R402254 Coma scale, best verbal response, oriented, 24 hours or more after hospital admission: Secondary | ICD-10-CM | POA: Diagnosis present

## 2017-05-04 DIAGNOSIS — R402144 Coma scale, eyes open, spontaneous, 24 hours or more after hospital admission: Secondary | ICD-10-CM | POA: Diagnosis present

## 2017-05-04 DIAGNOSIS — E039 Hypothyroidism, unspecified: Secondary | ICD-10-CM | POA: Diagnosis present

## 2017-05-04 DIAGNOSIS — Z90710 Acquired absence of both cervix and uterus: Secondary | ICD-10-CM | POA: Diagnosis not present

## 2017-05-04 DIAGNOSIS — R29701 NIHSS score 1: Secondary | ICD-10-CM | POA: Diagnosis present

## 2017-05-04 DIAGNOSIS — M48061 Spinal stenosis, lumbar region without neurogenic claudication: Secondary | ICD-10-CM | POA: Diagnosis present

## 2017-05-04 DIAGNOSIS — Z23 Encounter for immunization: Secondary | ICD-10-CM | POA: Diagnosis not present

## 2017-05-04 DIAGNOSIS — Z8249 Family history of ischemic heart disease and other diseases of the circulatory system: Secondary | ICD-10-CM | POA: Diagnosis not present

## 2017-05-04 DIAGNOSIS — R402364 Coma scale, best motor response, obeys commands, 24 hours or more after hospital admission: Secondary | ICD-10-CM | POA: Diagnosis present

## 2017-05-04 DIAGNOSIS — G458 Other transient cerebral ischemic attacks and related syndromes: Secondary | ICD-10-CM | POA: Diagnosis not present

## 2017-05-04 MED ORDER — SODIUM CHLORIDE 0.9 % IV SOLN
INTRAVENOUS | Status: DC
  Administered 2017-05-04: 20:00:00 via INTRAVENOUS

## 2017-05-04 MED ORDER — HEPARIN (PORCINE) IN NACL 100-0.45 UNIT/ML-% IJ SOLN
950.0000 [IU]/h | INTRAMUSCULAR | Status: DC
  Administered 2017-05-04: 950 [IU]/h via INTRAVENOUS
  Filled 2017-05-04: qty 250

## 2017-05-04 MED ORDER — SODIUM CHLORIDE 0.9% FLUSH: 3.0000 mL | Freq: Two times a day (BID) | INTRAVENOUS | Status: DC

## 2017-05-04 MED ORDER — SODIUM CHLORIDE 0.9 % IV SOLN: 250.0000 mL | INTRAVENOUS | Status: DC | PRN

## 2017-05-04 MED ORDER — SODIUM CHLORIDE 0.9% FLUSH: 3.0000 mL | INTRAVENOUS | Status: DC | PRN

## 2017-05-04 MED ORDER — HEPARIN BOLUS VIA INFUSION
2000.0000 [IU] | Freq: Once | INTRAVENOUS | Status: AC
  Administered 2017-05-04: 2000 [IU] via INTRAVENOUS
  Filled 2017-05-04: qty 2000

## 2017-05-04 NOTE — Care Management Obs Status (Signed)
[facility] NOTIFICATION   Patient Details  Name: Emily Murray MRN: [id number] Date of Birth: 02-15-1951   Medicare Observation Status Notification Given:  Yes    Erenest Rasher, RN 05/04/2017, 9:57 AM

## 2017-05-04 NOTE — Progress Notes (Signed)
ANTICOAGULATION CONSULT NOTE - Initial Consult  Pharmacy Consult for heparin Indication: chest pain/ACS  Allergies  Allergen Reactions  . Azo Cranberry [Cranberry Extract]     Caused numbness in lip  . Cranberry Other (See Comments)    Patient took Cranberry tablets and her lips "went numb"    Patient Measurements: Height: 5\' 3"  (160 cm) Weight: 159 lb 6.3 oz (72.3 kg) IBW/kg (Calculated) : 52.4 Heparin Dosing Weight: 68kg  Vital Signs: BP: 115/71 (08/08 0810)  Labs:  Recent Labs  05/02/17 1905 05/02/17 1916 05/02/17 2225 05/03/17 0323 05/03/17 0821  HGB 13.2 14.3  --  12.2  --   HCT 39.6 42.0  --  36.5  --   PLT 255  --   --  229  --   APTT 29  --   --   --   --   LABPROT 12.7  --   --   --   --   INR 0.96  --   --   --   --   CREATININE 0.88 0.80  --  0.75  --   TROPONINI  --   --  <0.03 <0.03 0.04*    Estimated Creatinine Clearance: 64.2 mL/min (by C-G formula based on SCr of 0.75 mg/dL).   Medical History: Past Medical History:  Diagnosis Date  . Anxiety   . Bipolar 1 disorder ([place])   . Coronary artery disease   . Depressed   . Hypertension     Medications:  Prescriptions Prior to Admission  Medication Sig Dispense Refill Last Dose  . aspirin EC 325 MG tablet Take 325 mg by mouth once as needed (for arm or chest pain).   05/02/2017 at am  . clonazePAM (KLONOPIN) 0.5 MG tablet Take 0.5 mg by mouth daily as needed for anxiety. For aneixty    PRN at PRN  . estrogens, conjugated, (PREMARIN) 0.625 MG tablet Take 0.625 mg by mouth daily. Take daily for 21 days then do not take for 7 days.   05/02/2017 at am  . eszopiclone (LUNESTA) 2 MG TABS tablet Take 2 mg by mouth at bedtime as needed.   PRN at PRN  . folic acid (FOLVITE) 1 MG tablet Take 1 mg by mouth daily.   05/01/2017 at am  . HYDROcodone-acetaminophen (NORCO/VICODIN) 5-325 MG tablet Take 0.5-1 tablets by mouth every morning.    05/02/2017 at 1500  . lamoTRIgine (LAMICTAL) 100 MG tablet Take 100 mg by mouth  every morning.   05/02/2017 at am  . levothyroxine (SYNTHROID, LEVOTHROID) 150 MCG tablet Take 150 mcg by mouth daily before breakfast.   05/02/2017 at am  . sertraline (ZOLOFT) 100 MG tablet Take 200 mg by mouth daily after breakfast.     05/02/2017 at am  . Vitamin D, Ergocalciferol, (DRISDOL) 50000 UNITS CAPS capsule Take 50,000 Units by mouth every 7 (seven) days.   05/01/2017 at am  . cyclobenzaprine (FLEXERIL) 10 MG tablet Take 0.5 tablets (5 mg total) by mouth 2 (two) times daily as needed for muscle spasms. (Patient not taking: Reported on 05/02/2017) 20 tablet 0 Not Taking at Unknown time  . olmesartan-hydrochlorothiazide (BENICAR HCT) 40-12.5 MG per tablet Take 1 tablet by mouth daily.     08/02/2014 at Unknown time   Scheduled:  . aspirin EC  81 mg Oral Daily  . enoxaparin (LOVENOX) injection  40 mg Subcutaneous Q24H  . irbesartan  300 mg Oral Daily  . lamoTRIgine  100 mg Oral q morning - 10a  .  levothyroxine  150 mcg Oral QAC breakfast  . pneumococcal 23 valent vaccine  0.5 mL Intramuscular Tomorrow-1000  . sertraline  200 mg Oral QPC breakfast  . sodium chloride flush  3 mL Intravenous Q12H    Assessment: 66 yo female for r/o ACS (history of CAD) to begin heparin. There was a concern for CVA but MRI was negative. She is on sq lovenox with last dose at 8am today  Goal of Therapy:  Heparin level 0.3-0.7 units/ml Monitor platelets by anticoagulation protocol: Yes   Plan:  -d/c lovenox -Heparin 2000 units IV bolus followed by 950 units/hr -Heparin level in 6 hours and daily wth CBC daily  Hildred Laser, Pharm D 05/04/2017 6:09 PM

## 2017-05-04 NOTE — Progress Notes (Signed)
[facility]  PROGRESS NOTE  Emily Murray QQP:[id number] DOB: 1950-10-15 DOA: 05/02/2017 PCP: Vincente Liberty, MD   Brief HPI:   66 y.o.femalewith medical history significant of coronary artery disease, hypertension, depression, bipolar disorder, anxiety and some chronic knee and back pain, remote history of cocaine abuse, comes in for right shoulder pain, left flank pain, right facial numbness which resolved spontaneously. She denies any chest pain. She underwent an MRI brain to evaluate for right facial and right sided lip numbness, which did not show any acute stroke. She is not sure if its from the pain medication. Neurology consulted in ED , recommended MRI and if negative, should follow up with outpatient neurology if symptoms re occur. TSH wnl. Vitamin b12 and urine tox pending.     Subjective   Patient seen and examined, continues to have left jaw discomfort/numbness. MRI of the brain is negative for stroke.   Assessment/Plan:     1. Left flank pain- likely from lumbar spinal stenosis seen on MRI of lumbar spine.  2. Chest pain- patient has numbness/discomfort of right jaw, has T-wave inversions on EKG. Mild elevation of troponin 0.04. Will consult cardiology for further evaluation and management. 3. Hypertension-blood pressures are controlled, continue Avapro. 4. Hypothyroidism-TSH is normal, continue Synthroid 5. Bipolar disorder-stable 6. Right lip and facial numbness-MRI brain negative, neurology has signed off.    DVT prophylaxis: Lovenox  Code Status: Full code  Family Communication: No family at bedside   Disposition Plan: Pending cardiology evaluation   Consultants:  Neurology  Procedures:  None  Continuous infusions . sodium chloride    . sodium chloride    . sodium chloride 50 mL/hr at 05/04/17 2000  . heparin 950 Units/hr (05/04/17 1900)      Antibiotics:   Anti-infectives    None       Objective   Vitals:   05/03/17  2348 05/04/17 0344 05/04/17 0400 05/04/17 0810  BP: 130/84 108/69 124/79 115/71  Pulse: 63 66 66   Resp: 18  16   Temp: 98.3 F (36.8 C) 98.6 F (37 C) 98.5 F (36.9 C)   TempSrc: Oral Oral Oral   SpO2: 99% 99% 99%   Weight:   72.3 kg (159 lb 6.3 oz)   Height:        Intake/Output Summary (Last 24 hours) at 05/04/17 2025 Last data filed at 05/04/17 0813  Gross per 24 hour  Intake              120 ml  Output                0 ml  Net              120 ml   Filed Weights   05/02/17 1900 05/03/17 0116 05/04/17 0400  Weight: 74.3 kg (163 lb 12.8 oz) 73.9 kg (163 lb) 72.3 kg (159 lb 6.3 oz)     Physical Examination:   Physical Exam: Eyes: No icterus, extraocular muscles intact  Mouth: Oral mucosa is moist, no lesions on palate,  Neck: Supple, no deformities, masses, or tenderness Lungs: Normal respiratory effort, bilateral clear to auscultation, no crackles or wheezes.  Heart: Regular rate and rhythm, S1 and S2 normal, no murmurs, rubs auscultated Abdomen: BS normoactive,soft,nondistended,non-tender to palpation,no organomegaly Extremities: No pretibial edema, no erythema, no cyanosis, no clubbing Neuro : Alert and oriented to time, place and person, No focal deficits  Skin: No rashes seen on exam     Data Reviewed:  I have personally reviewed following labs and imaging studies  CBG:  Recent Labs Lab 05/02/17 1908  GLUCAP 119*    CBC:  Recent Labs Lab 05/02/17 1905 05/02/17 1916 05/03/17 0323  WBC 11.2*  --  8.6  NEUTROABS 9.8*  --   --   HGB 13.2 14.3 12.2  HCT 39.6 42.0 36.5  MCV 81.3  --  80.6  PLT 255  --  154    Basic Metabolic Panel:  Recent Labs Lab 05/02/17 1905 05/02/17 1916 05/02/17 2225 05/03/17 0323  NA 135 139  --  139  K 3.6 3.8  --  3.5  CL 104 104  --  107  CO2 23  --   --  25  GLUCOSE 118* 116*  --  115*  BUN 14 16  --  11  CREATININE 0.88 0.80  --  0.75  CALCIUM 9.0  --   --  8.7*  MG  --   --  1.6* 1.8  PHOS  --   --   2.8 3.3    No results found for this or any previous visit (from the past 240 hour(s)).   Liver Function Tests:  Recent Labs Lab 05/02/17 1905 05/03/17 0323  AST 57* 33  ALT 25 26  ALKPHOS 102 91  BILITOT 0.2* 0.3  PROT 5.8* 5.5*  ALBUMIN 3.5 3.0*   No results for input(s): LIPASE, AMYLASE in the last 168 hours. No results for input(s): AMMONIA in the last 168 hours.  Cardiac Enzymes:  Recent Labs Lab 05/02/17 2225 05/03/17 0323 05/03/17 0821  TROPONINI <0.03 <0.03 0.04*   BNP (last 3 results) No results for input(s): BNP in the last 8760 hours.  ProBNP (last 3 results) No results for input(s): PROBNP in the last 8760 hours.    Studies: Mr Lumbar Spine Wo Contrast  Result Date: 05/03/2017 CLINICAL DATA:  66 y/o F; Back pain, < 6wks, no red flags, no prior management. EXAM: MRI LUMBAR SPINE WITHOUT CONTRAST TECHNIQUE: Multiplanar, multisequence MR imaging of the lumbar spine was performed. No intravenous contrast was administered. COMPARISON:  05/02/2017 abdomen radiograph.  12/18/2015 lumbar MRI. FINDINGS: Segmentation:  Standard. Alignment:  Stable minimal grade 1 L4-5 anterolisthesis. Vertebrae: Mild bilateral facet edema and effusions at the L4-5 level, similar to prior MRI, likely degenerative. No evidence for discitis or acute fracture. Conus medullaris: Extends to the L2 level and appears normal. Stable S2 level Tarlov cysts. Paraspinal and other soft tissues: Negative. Disc levels: L1-2: No significant disc displacement, foraminal narrowing, or canal stenosis. L2-3: Small disc bulge and mild facet hypertrophy. No significant foraminal or canal stenosis. L3-4: Small disc bulge eccentric to the left with mild facet hypertrophy and trace degenerative facet effusions. Mild left foraminal and lateral recess stenosis. No significant canal stenosis. L4-5: Anterolisthesis with uncovered disc bulge, advanced facet hypertrophy, and mild ligamentum flavum hypertrophy. Mild  left-greater-than-right foraminal stenosis. Mild canal stenosis. L5-S1: Small disc bulge with marginal osteophytes eccentric to the right and mild facet hypertrophy. Mild right-greater-than-left foraminal stenosis. No significant canal stenosis. IMPRESSION: 1. Stable grade 1 L4-5 anterolisthesis. 2. Stable L4-5 facet edema and small effusions, likely degenerative. 3. Stable lumbar degenerative changes greatest at the L4-5 level with there is mild canal stenosis. 4. Multilevel mild foraminal stenosis. No high-grade foraminal stenosis. Electronically Signed   By: Kristine Garbe M.D.   On: 05/03/2017 13:38   Mr Brain Limited Wo Contrast  Result Date: 05/02/2017 CLINICAL DATA:  66 y/o F; TIA, initial examination. Right  face and right arm numbness with tingling. EXAM: MRI HEAD WITHOUT CONTRAST TECHNIQUE: Axial DWI, coronal DWI, axial T2 PROPELLER, and axial T2 FLAIR sequences were obtained. COMPARISON:  05/02/2017 CT head FINDINGS: Brain: No acute infarction, hemorrhage, hydrocephalus, extra-axial collection or mass lesion. Few scattered nonspecific foci of T2 FLAIR hyperintense signal abnormality in subcortical and periventricular white matter as well as the pons is compatible with mild chronic microvascular ischemic changes for age. Mild brain parenchymal volume loss. Vascular: Normal flow voids. Skull and upper cervical spine: Normal marrow signal. Sinuses/Orbits: Negative. Other: None. IMPRESSION: 1. No acute intracranial abnormality identified. 2. Mild chronic microvascular ischemic changes and mild parenchymal volume loss of the brain. Electronically Signed   By: Kristine Garbe M.D.   On: 05/02/2017 21:01    Scheduled Meds: . aspirin EC  81 mg Oral Daily  . irbesartan  300 mg Oral Daily  . lamoTRIgine  100 mg Oral q morning - 10a  . levothyroxine  150 mcg Oral QAC breakfast  . pneumococcal 23 valent vaccine  0.5 mL Intramuscular Tomorrow-1000  . sertraline  200 mg Oral QPC breakfast   . sodium chloride flush  3 mL Intravenous Q12H      Time spent: 20 min  [facility] Pager [phone]. If 7PM-7AM, please contact night-coverage at [email or web address], Office  [phone]  password [email or web address]  05/04/2017, 8:25 PM  LOS: 0 days

## 2017-05-04 NOTE — Care Management Note (Signed)
Case Management Note  Patient Details  Name: TIWATOPE EMMITT MRN: [id number] Date of Birth: [birth year]  Subjective/Objective:    Chest pain, HTN                Action/Plan: Discharge Planning: NCM spoke to pt at bedside. States she can afford medications. Requesting note for work. Notified attending. Explained the importance of compliance with medical treatment and taking medications as prescribed. No NCM needs identified.   PCP Vincente Liberty MD  Expected Discharge Date:  05/04/2017               Expected Discharge Plan:  Home/Self Care  In-House Referral:  NA  Discharge planning Services  CM Consult  Post Acute Care Choice:  NA Choice offered to:  NA  DME Arranged:  N/A DME Agency:  NA  HH Arranged:  NA HH Agency:  NA  Status of Service:  Completed, signed off  If discussed at [place] of Stay Meetings, dates discussed:    Additional Comments:  Erenest Rasher, RN 05/04/2017, 11:18 AM

## 2017-05-05 ENCOUNTER — Encounter (HOSPITAL_COMMUNITY)
Admission: EM | Disposition: A | Discharge status: Home / Self Care | Payer: Self-pay | Source: Home / Self Care | Attending: Family Medicine

## 2017-05-05 ENCOUNTER — Encounter (HOSPITAL_COMMUNITY): Payer: Self-pay | Admitting: Cardiovascular Disease

## 2017-05-05 HISTORY — PX: LEFT HEART CATH AND CORONARY ANGIOGRAPHY: CATH118249

## 2017-05-05 LAB — BASIC METABOLIC PANEL
Anion gap: 7 (ref 5–15)
BUN: 11 mg/dL (ref 6–20)
CALCIUM: 8.7 mg/dL — AB (ref 8.9–10.3)
CO2: 24 mmol/L (ref 22–32)
CREATININE: 0.72 mg/dL (ref 0.44–1.00)
Chloride: 108 mmol/L (ref 101–111)
Glucose, Bld: 81 mg/dL (ref 65–99)
Potassium: 3.9 mmol/L (ref 3.5–5.1)
Sodium: 139 mmol/L (ref 135–145)

## 2017-05-05 LAB — CBC
HEMATOCRIT: 37.9 % (ref 36.0–46.0)
Hemoglobin: 12.6 g/dL (ref 12.0–15.0)
MCH: 27 pg (ref 26.0–34.0)
MCHC: 33.2 g/dL (ref 30.0–36.0)
MCV: 81.3 fL (ref 78.0–100.0)
PLATELETS: 263 10*3/uL (ref 150–400)
RBC: 4.66 MIL/uL (ref 3.87–5.11)
RDW: 15.6 % — AB (ref 11.5–15.5)
WBC: 5.6 10*3/uL (ref 4.0–10.5)

## 2017-05-05 LAB — POCT ACTIVATED CLOTTING TIME: ACTIVATED CLOTTING TIME: 131 s

## 2017-05-05 LAB — PROTIME-INR
INR: 0.97
Prothrombin Time: 12.9 seconds (ref 11.4–15.2)

## 2017-05-05 SURGERY — LEFT HEART CATH AND CORONARY ANGIOGRAPHY
Anesthesia: LOCAL

## 2017-05-05 MED ORDER — SODIUM CHLORIDE 0.9% FLUSH
3.0000 mL | Freq: Two times a day (BID) | INTRAVENOUS | Status: DC
Start: 2017-05-05 — End: 2017-05-05

## 2017-05-05 MED ORDER — HEPARIN (PORCINE) IN NACL 2-0.9 UNIT/ML-% IJ SOLN
INTRAMUSCULAR | Status: AC | PRN
  Administered 2017-05-05: 1000 mL

## 2017-05-05 MED ORDER — FENTANYL CITRATE (PF) 100 MCG/2ML IJ SOLN
INTRAMUSCULAR | Status: DC | PRN
  Administered 2017-05-05: 25 ug via INTRAVENOUS

## 2017-05-05 MED ORDER — ASPIRIN 81 MG PO TBEC: 81.0000 mg | DELAYED_RELEASE_TABLET | Freq: Every day | ORAL | 2 refills | Status: DC

## 2017-05-05 MED ORDER — SODIUM CHLORIDE 0.9 % IV SOLN: 250.0000 mL | INTRAVENOUS | Status: DC | PRN

## 2017-05-05 MED ORDER — LIDOCAINE HCL (PF) 1 % IJ SOLN
INTRAMUSCULAR | Status: DC | PRN
Start: 2017-05-05 — End: 2017-05-05
  Administered 2017-05-05: 15 mL

## 2017-05-05 MED ORDER — HEPARIN (PORCINE) IN NACL 2-0.9 UNIT/ML-% IJ SOLN
INTRAMUSCULAR | Status: AC
  Filled 2017-05-05: qty 1000

## 2017-05-05 MED ORDER — SODIUM CHLORIDE 0.9 % IV SOLN: INTRAVENOUS | Status: AC

## 2017-05-05 MED ORDER — IOPAMIDOL (ISOVUE-370) INJECTION 76%
INTRAVENOUS | Status: DC | PRN
  Administered 2017-05-05: 30 mL via INTRA_ARTERIAL

## 2017-05-05 MED ORDER — MIDAZOLAM HCL 2 MG/2ML IJ SOLN
INTRAMUSCULAR | Status: DC | PRN
  Administered 2017-05-05: 1 mg via INTRAVENOUS

## 2017-05-05 MED ORDER — IOPAMIDOL (ISOVUE-370) INJECTION 76%
INTRAVENOUS | Status: AC
  Filled 2017-05-05: qty 100

## 2017-05-05 MED ORDER — FENTANYL CITRATE (PF) 100 MCG/2ML IJ SOLN
INTRAMUSCULAR | Status: AC
  Filled 2017-05-05: qty 2

## 2017-05-05 MED ORDER — LIDOCAINE HCL 1 % IJ SOLN
INTRAMUSCULAR | Status: AC
Start: 2017-05-05 — End: ?
  Filled 2017-05-05: qty 20

## 2017-05-05 MED ORDER — SODIUM CHLORIDE 0.9% FLUSH: 3.0000 mL | INTRAVENOUS | Status: DC | PRN

## 2017-05-05 MED ORDER — MIDAZOLAM HCL 2 MG/2ML IJ SOLN
INTRAMUSCULAR | Status: AC
Start: 2017-05-05 — End: ?
  Filled 2017-05-05: qty 2

## 2017-05-05 SURGICAL SUPPLY — 6 items
CATH INFINITI 5FR MULTPACK ANG (CATHETERS) ×1 IMPLANT
KIT HEART LEFT (KITS) ×2 IMPLANT
PACK CARDIAC CATHETERIZATION (CUSTOM PROCEDURE TRAY) ×2 IMPLANT
SHEATH PINNACLE 5F 10CM (SHEATH) ×1 IMPLANT
TRANSDUCER W/STOPCOCK (MISCELLANEOUS) ×2 IMPLANT
WIRE EMERALD 3MM-J .035X150CM (WIRE) ×1 IMPLANT

## 2017-05-05 NOTE — H&P (View-Only) (Signed)
Late entry: Referring Physician:  [name] is an 66 y.o. female.                       Chief Complaint: Atypical chest pain, abnormal EKG and Troponin-I.   HPI: 66 year old female was admitted for possible TIA with right sided jaw numbness, shoulder pain, EKG changes of inferior wall ischemia and Troponin-I trending upward. Her Neurological exam and investigations were unrevealing.   Past Medical History:  Diagnosis Date  . Anxiety   . Bipolar 1 disorder ([place])   . Coronary artery disease   . Depressed   . Hypertension       Past Surgical History:  Procedure Laterality Date  . ABDOMINAL HYSTERECTOMY    . rotator cuff repair Right 2015    Family History  Problem Relation Age of Onset  . Hypertension Mother   . Diabetes Neg Hx   . Cancer Neg Hx   . CAD Neg Hx    Social History:  reports that she has never smoked. She has never used smokeless tobacco. She reports that she does not drink alcohol or use drugs.  Allergies:  Allergies  Allergen Reactions  . Azo Cranberry [Cranberry Extract]     Caused numbness in lip  . Cranberry Other (See Comments)    Patient took Cranberry tablets and her lips "went numb"    Medications Prior to Admission  Medication Sig Dispense Refill  . aspirin EC 325 MG tablet Take 325 mg by mouth once as needed (for arm or chest pain).    . clonazePAM (KLONOPIN) 0.5 MG tablet Take 0.5 mg by mouth daily as needed for anxiety. For aneixty     . estrogens, conjugated, (PREMARIN) 0.625 MG tablet Take 0.625 mg by mouth daily. Take daily for 21 days then do not take for 7 days.    . eszopiclone (LUNESTA) 2 MG TABS tablet Take 2 mg by mouth at bedtime as needed.    . folic acid (FOLVITE) 1 MG tablet Take 1 mg by mouth daily.    Marland Kitchen HYDROcodone-acetaminophen (NORCO/VICODIN) 5-325 MG tablet Take 0.5-1 tablets by mouth every morning.     . lamoTRIgine (LAMICTAL) 100 MG tablet Take 100 mg by mouth every morning.    Marland Kitchen levothyroxine (SYNTHROID,  LEVOTHROID) 150 MCG tablet Take 150 mcg by mouth daily before breakfast.    . sertraline (ZOLOFT) 100 MG tablet Take 200 mg by mouth daily after breakfast.      . Vitamin D, Ergocalciferol, (DRISDOL) 50000 UNITS CAPS capsule Take 50,000 Units by mouth every 7 (seven) days.    . cyclobenzaprine (FLEXERIL) 10 MG tablet Take 0.5 tablets (5 mg total) by mouth 2 (two) times daily as needed for muscle spasms. (Patient not taking: Reported on 05/02/2017) 20 tablet 0  . olmesartan-hydrochlorothiazide (BENICAR HCT) 40-12.5 MG per tablet Take 1 tablet by mouth daily.        Results for orders placed or performed during the hospital encounter of 05/02/17 (from the past 48 hour(s))  Troponin I (q 6hr x 3)     Status: Abnormal   Collection Time: 05/03/17  8:21 AM  Result Value Ref Range   Troponin I 0.04 (HH) <0.03 ng/mL    Comment: CRITICAL RESULT CALLED TO, READ BACK BY AND VERIFIED WITH: J.MILAM,RN 05/03/17 0933 BY BSLADE   Urine rapid drug screen (hosp performed)     Status: Abnormal   Collection Time: 05/03/17  5:27 PM  Result Value  Ref Range   Opiates POSITIVE (A) NONE DETECTED   Cocaine POSITIVE (A) NONE DETECTED   Benzodiazepines NONE DETECTED NONE DETECTED   Amphetamines NONE DETECTED NONE DETECTED   Tetrahydrocannabinol NONE DETECTED NONE DETECTED   Barbiturates NONE DETECTED NONE DETECTED    Comment:        DRUG SCREEN FOR MEDICAL PURPOSES ONLY.  IF CONFIRMATION IS NEEDED FOR ANY PURPOSE, NOTIFY LAB WITHIN 5 DAYS.        LOWEST DETECTABLE LIMITS FOR URINE DRUG SCREEN Drug Class       Cutoff (ng/mL) Amphetamine      1000 Barbiturate      200 Benzodiazepine   664 Tricyclics       403 Opiates          300 Cocaine          300 THC              50   CBC     Status: Abnormal   Collection Time: 05/05/17  3:45 AM  Result Value Ref Range   WBC 5.6 4.0 - 10.5 K/uL   RBC 4.66 3.87 - 5.11 MIL/uL   Hemoglobin 12.6 12.0 - 15.0 g/dL   HCT 37.9 36.0 - 46.0 %   MCV 81.3 78.0 - 100.0 fL    MCH 27.0 26.0 - 34.0 pg   MCHC 33.2 30.0 - 36.0 g/dL   RDW 15.6 (H) 11.5 - 15.5 %   Platelets 263 150 - 400 K/uL  Basic metabolic panel     Status: Abnormal   Collection Time: 05/05/17  3:45 AM  Result Value Ref Range   Sodium 139 135 - 145 mmol/L   Potassium 3.9 3.5 - 5.1 mmol/L   Chloride 108 101 - 111 mmol/L   CO2 24 22 - 32 mmol/L   Glucose, Bld 81 65 - 99 mg/dL   BUN 11 6 - 20 mg/dL   Creatinine, Ser 0.72 0.44 - 1.00 mg/dL   Calcium 8.7 (L) 8.9 - 10.3 mg/dL   GFR calc non Af Amer >60 >60 mL/min   GFR calc Af Amer >60 >60 mL/min    Comment: (NOTE) The eGFR has been calculated using the CKD EPI equation. This calculation has not been validated in all clinical situations. eGFR's persistently <60 mL/min signify possible Chronic Kidney Disease.    Anion gap 7 5 - 15  Protime-INR     Status: None   Collection Time: 05/05/17  3:45 AM  Result Value Ref Range   Prothrombin Time 12.9 11.4 - 15.2 seconds   INR 0.97    Mr Lumbar Spine Wo Contrast  Result Date: 05/03/2017 CLINICAL DATA:  66 y/o F; Back pain, < 6wks, no red flags, no prior management. EXAM: MRI LUMBAR SPINE WITHOUT CONTRAST TECHNIQUE: Multiplanar, multisequence MR imaging of the lumbar spine was performed. No intravenous contrast was administered. COMPARISON:  05/02/2017 abdomen radiograph.  12/18/2015 lumbar MRI. FINDINGS: Segmentation:  Standard. Alignment:  Stable minimal grade 1 L4-5 anterolisthesis. Vertebrae: Mild bilateral facet edema and effusions at the L4-5 level, similar to prior MRI, likely degenerative. No evidence for discitis or acute fracture. Conus medullaris: Extends to the L2 level and appears normal. Stable S2 level Tarlov cysts. Paraspinal and other soft tissues: Negative. Disc levels: L1-2: No significant disc displacement, foraminal narrowing, or canal stenosis. L2-3: Small disc bulge and mild facet hypertrophy. No significant foraminal or canal stenosis. L3-4: Small disc bulge eccentric to the left with  mild facet hypertrophy and trace  degenerative facet effusions. Mild left foraminal and lateral recess stenosis. No significant canal stenosis. L4-5: Anterolisthesis with uncovered disc bulge, advanced facet hypertrophy, and mild ligamentum flavum hypertrophy. Mild left-greater-than-right foraminal stenosis. Mild canal stenosis. L5-S1: Small disc bulge with marginal osteophytes eccentric to the right and mild facet hypertrophy. Mild right-greater-than-left foraminal stenosis. No significant canal stenosis. IMPRESSION: 1. Stable grade 1 L4-5 anterolisthesis. 2. Stable L4-5 facet edema and small effusions, likely degenerative. 3. Stable lumbar degenerative changes greatest at the L4-5 level with there is mild canal stenosis. 4. Multilevel mild foraminal stenosis. No high-grade foraminal stenosis. Electronically Signed   By: Kristine Garbe M.D.   On: 05/03/2017 13:38    Review Of Systems Constitutional: No fever, chills, weight loss or gain. Eyes: No vision change, wears glasses. No discharge or pain. Ears: No hearing loss, No tinnitus. Respiratory: No asthma, COPD, pneumonias. No shortness of breath. No hemoptysis. Cardiovascular: Positive chest pain, no palpitation, leg edema. Gastrointestinal: No nausea, vomiting, diarrhea, constipation. No GI bleed. No hepatitis. Genitourinary: No dysuria, hematuria, kidney stone. No incontinance. Neurological: No headache, stroke, seizures.  Psychiatry: No psych facility admission for anxiety, depression, suicide. No detox. Skin: No rash. Musculoskeletal: Positive joint pain, no fibromyalgia. No neck pain, back pain. Lymphadenopathy: No lymphadenopathy. Hematology: No anemia or easy bruising.   Blood pressure 140/69, pulse 66, temperature 98.1 F (36.7 C), temperature source Oral, resp. rate 16, height _0  (1.6 m), weight 74.9 kg (165 lb 1.6 oz), SpO2 100 %. Body mass index is 29.25 kg/m. General appearance: alert, cooperative, appears stated age  and no distress Head: Normocephalic, atraumatic. Eyes: Brown eyes, pink conjunctiva, corneas clear. PERRL, EOM's intact. Neck: No adenopathy, no carotid bruit, no JVD, supple, symmetrical, trachea midline and thyroid not enlarged. Resp: Clear to auscultation bilaterally. Cardio: Regular rate and rhythm, S1, S2 normal, II/VI systolic murmur, no click, rub or gallop GI: Soft, non-tender; bowel sounds normal; no organomegaly. Extremities: No edema, cyanosis or clubbing. 2 + femoral pulses. Skin: Warm and dry.  Neurologic: Alert and oriented X 3, normal strength. Normal coordination and gait.  Assessment/Plan Atypical chest pain r/o CAD Hypertension Hypothyroidism Bipolar disorder  Cardiac cath v/s nuclear stress test. Patient understood procedure, risks and alternatives and wants to proceed with cardiac catheterization.   Birdie Riddle, MD  05/05/2017, 6:57 AM

## 2017-05-05 NOTE — Progress Notes (Signed)
Site area: rt groin fa sheath Site Prior to Removal:  Level 0 Pressure Applied For: 20 minutes Manual:   yes Patient Status During Pull:  stable Post Pull Site:  Level 0 Post Pull Instructions Given:  yes Post Pull Pulses Present: palpable Dressing Applied:  Gauze and tegaderm Bedrest begins @ 2449 Comments:

## 2017-05-05 NOTE — Discharge Instructions (Signed)
Chest Wall Pain °Chest wall pain is pain in or around the bones and muscles of your chest. Sometimes, an injury causes this pain. Sometimes, the cause may not be known. This pain may take several weeks or longer to get better. °Follow these instructions at home: °Pay attention to any changes in your symptoms. Take these actions to help with your pain: °· Rest as told by your doctor. °· Avoid activities that cause pain. Try not to use your chest, belly (abdominal), or side muscles to lift heavy things. °· If directed, apply ice to the painful area: °? Put ice in a plastic bag. °? Place a towel between your skin and the bag. °? Leave the ice on for 20 minutes, 2-3 times per day. °· Take over-the-counter and prescription medicines only as told by your doctor. °· Do not use tobacco products, including cigarettes, chewing tobacco, and e-cigarettes. If you need help quitting, ask your doctor. °· Keep all follow-up visits as told by your doctor. This is important. ° °Contact a doctor if: °· You have a fever. °· Your chest pain gets worse. °· You have new symptoms. °Get help right away if: °· You feel sick to your stomach (nauseous) or you throw up (vomit). °· You feel sweaty or light-headed. °· You have a cough with phlegm (sputum) or you cough up blood. °· You are short of breath. °This information is not intended to replace advice given to you by your health care provider. Make sure you discuss any questions you have with your health care provider. °Document Released: 03/01/2008 Document Revised: 02/19/2016 Document Reviewed: 12/09/2014 °Elsevier Interactive Patient Education © 2018 [employer] ° °

## 2017-05-05 NOTE — Discharge Summary (Signed)
Physician Discharge Summary  Emily Murray SFK:[id number] DOB: 23-Nov-1950 DOA: 05/02/2017  PCP: Emily Liberty, MD  Admit date: 05/02/2017 Discharge date: 05/05/2017  Time spent: 35 minutes  Recommendations for Outpatient Follow-up:  1. Follow-up cardiology in 2 weeks 2. Follow PCP in 2 weeks   Discharge Diagnoses:  Active Problems:   Chest pain   Essential hypertension   Prolonged QT interval   Bipolar 1 disorder (HCC)   Left flank pain   Discharge Condition: Stable  Diet recommendation: Heart healthy diet  Filed Weights   05/03/17 0116 05/04/17 0400 05/05/17 0335  Weight: 73.9 kg (163 lb) 72.3 kg (159 lb 6.3 oz) 74.9 kg (165 lb 1.6 oz)    History of present illness:  66 y.o.femalewith medical history significant of coronary artery disease, hypertension, depression, bipolar disorder, anxiety and some chronic knee and back pain, remote history of cocaine abuse, comes in for right shoulder pain, left flank pain, right facial numbness which resolved spontaneously. She denies any chest pain. She underwent an MRI brain to evaluate for right facial and right sided lip numbness, which did not show any acute stroke. She is not sure if its from the pain medication. Neurology consulted in ED , recommended MRI and if negative, should follow up with outpatient neurology if symptoms re occur. TSH wnl. Vitamin b12 and urine tox pending  Hospital Course:  1. Left flank pain- likely from lumbar spinal stenosis seen on MRI of lumbar spine. Continue Vicodin when necessary at home.  Chest pain- patient complained of numbness/discomfort of right jaw,  T-wave inversions on EKG. Mild elevation of troponin 0.04. Cardiology was consulted and patient underwent cardiac catheterization which only show mild stenosis.   Prox LAD to Mid LAD lesion, 20 %stenosed.  Ost Cx to Prox Cx lesion, 15 %stenosed.  Prox RCA to Mid RCA lesion, 20 %stenosed.Medical management recommended. Will discharge  patient on aspirin 81 mg by mouth daily  2. Hypertension-blood pressures are controlled, continue Benicar HCTZ  3. Jaw pain/ numbness - patient has a fitting dentures, however recommended to follow-up with dentist as outpatient  4. Hypothyroidism-TSH is normal, continue Synthroid 5. Bipolar disorder-stable 6. Right lip and facial numbness-MRI brain negative, neurology has signed off.  Procedures:  Cardiac catheterization   Consultations:  Cardiology    neurology   Discharge Exam: Vitals:   05/05/17 0855 05/05/17 1500  BP: (!) 161/90 (!) 164/107  Pulse: 63 66  Resp: 14 15  Temp:  98.4 F (36.9 C)  SpO2: 100% 100%    General: Appears in no acute distress  Cardiovascular: S1-S2 regular Mouth- mild redness noted at the lower right gum line, edentulous Abdomen- soft, nontender, no organomegaly   Discharge Instructions   Discharge Instructions    Diet - low sodium heart healthy    Complete by:  As directed    Increase activity slowly    Complete by:  As directed      Current Discharge Medication List    CONTINUE these medications which have CHANGED   Details  aspirin EC 81 MG EC tablet Take 1 tablet (81 mg total) by mouth daily. Qty: 30 tablet, Refills: 2      CONTINUE these medications which have NOT CHANGED   Details  clonazePAM (KLONOPIN) 0.5 MG tablet Take 0.5 mg by mouth daily as needed for anxiety. For aneixty     estrogens, conjugated, (PREMARIN) 0.625 MG tablet Take 0.625 mg by mouth daily. Take daily for 21 days then do not take for  7 days.    eszopiclone (LUNESTA) 2 MG TABS tablet Take 2 mg by mouth at bedtime as needed.    folic acid (FOLVITE) 1 MG tablet Take 1 mg by mouth daily.    HYDROcodone-acetaminophen (NORCO/VICODIN) 5-325 MG tablet Take 0.5-1 tablets by mouth every morning.     lamoTRIgine (LAMICTAL) 100 MG tablet Take 100 mg by mouth every morning.    levothyroxine (SYNTHROID, LEVOTHROID) 150 MCG tablet Take 150 mcg by mouth daily  before breakfast.    sertraline (ZOLOFT) 100 MG tablet Take 200 mg by mouth daily after breakfast.      Vitamin D, Ergocalciferol, (DRISDOL) 50000 UNITS CAPS capsule Take 50,000 Units by mouth every 7 (seven) days.    cyclobenzaprine (FLEXERIL) 10 MG tablet Take 0.5 tablets (5 mg total) by mouth 2 (two) times daily as needed for muscle spasms. Qty: 20 tablet, Refills: 0    olmesartan-hydrochlorothiazide (BENICAR HCT) 40-12.5 MG per tablet Take 1 tablet by mouth daily.         Allergies  Allergen Reactions  . Azo Cranberry [Cranberry Extract]     Caused numbness in lip  . Cranberry Other (See Comments)    Patient took Cranberry tablets and her lips "went numb"      The results of significant diagnostics from this hospitalization (including imaging, microbiology, ancillary and laboratory) are listed below for reference.    Significant Diagnostic Studies: Mr Lumbar Spine Wo Contrast  Result Date: 05/03/2017 CLINICAL DATA:  66 y/o F; Back pain, < 6wks, no red flags, no prior management. EXAM: MRI LUMBAR SPINE WITHOUT CONTRAST TECHNIQUE: Multiplanar, multisequence MR imaging of the lumbar spine was performed. No intravenous contrast was administered. COMPARISON:  05/02/2017 abdomen radiograph.  12/18/2015 lumbar MRI. FINDINGS: Segmentation:  Standard. Alignment:  Stable minimal grade 1 L4-5 anterolisthesis. Vertebrae: Mild bilateral facet edema and effusions at the L4-5 level, similar to prior MRI, likely degenerative. No evidence for discitis or acute fracture. Conus medullaris: Extends to the L2 level and appears normal. Stable S2 level Tarlov cysts. Paraspinal and other soft tissues: Negative. Disc levels: L1-2: No significant disc displacement, foraminal narrowing, or canal stenosis. L2-3: Small disc bulge and mild facet hypertrophy. No significant foraminal or canal stenosis. L3-4: Small disc bulge eccentric to the left with mild facet hypertrophy and trace degenerative facet effusions.  Mild left foraminal and lateral recess stenosis. No significant canal stenosis. L4-5: Anterolisthesis with uncovered disc bulge, advanced facet hypertrophy, and mild ligamentum flavum hypertrophy. Mild left-greater-than-right foraminal stenosis. Mild canal stenosis. L5-S1: Small disc bulge with marginal osteophytes eccentric to the right and mild facet hypertrophy. Mild right-greater-than-left foraminal stenosis. No significant canal stenosis. IMPRESSION: 1. Stable grade 1 L4-5 anterolisthesis. 2. Stable L4-5 facet edema and small effusions, likely degenerative. 3. Stable lumbar degenerative changes greatest at the L4-5 level with there is mild canal stenosis. 4. Multilevel mild foraminal stenosis. No high-grade foraminal stenosis. Electronically Signed   By: Kristine Garbe M.D.   On: 05/03/2017 13:38   Mr Brain Limited Wo Contrast  Result Date: 05/02/2017 CLINICAL DATA:  66 y/o F; TIA, initial examination. Right face and right arm numbness with tingling. EXAM: MRI HEAD WITHOUT CONTRAST TECHNIQUE: Axial DWI, coronal DWI, axial T2 PROPELLER, and axial T2 FLAIR sequences were obtained. COMPARISON:  05/02/2017 CT head FINDINGS: Brain: No acute infarction, hemorrhage, hydrocephalus, extra-axial collection or mass lesion. Few scattered nonspecific foci of T2 FLAIR hyperintense signal abnormality in subcortical and periventricular white matter as well as the pons is compatible with mild  chronic microvascular ischemic changes for age. Mild brain parenchymal volume loss. Vascular: Normal flow voids. Skull and upper cervical spine: Normal marrow signal. Sinuses/Orbits: Negative. Other: None. IMPRESSION: 1. No acute intracranial abnormality identified. 2. Mild chronic microvascular ischemic changes and mild parenchymal volume loss of the brain. Electronically Signed   By: Kristine Garbe M.D.   On: 05/02/2017 21:01   Ct Head Code Stroke Wo Contrast  Result Date: 05/02/2017 CLINICAL DATA:  Code stroke.  66 y/o F; right-sided tingling, right-sided facial droop, aphasia. EXAM: CT HEAD WITHOUT CONTRAST TECHNIQUE: Contiguous axial images were obtained from the base of the skull through the vertex without intravenous contrast. COMPARISON:  08/23/2011 CT head FINDINGS: Brain: No evidence of acute infarction, hemorrhage, hydrocephalus, extra-axial collection or mass lesion/mass effect. Vascular: No hyperdense vessel identified. Mild calcific atherosclerosis of carotid siphons. Skull: Normal. Negative for fracture or focal lesion. Sinuses/Orbits: No acute finding. Other: None. ASPECTS [facility] Stroke Program Early CT Score) - Ganglionic level infarction (caudate, lentiform nuclei, internal capsule, insula, M1-M3 cortex): 7 - Supraganglionic infarction (M4-M6 cortex): 3 Total score (0-10 with 10 being normal): 10 IMPRESSION: 1. No acute intracranial abnormality identified. 2. ASPECTS is 10. These results were called by telephone at the time of interpretation on 05/02/2017 at 7:32 pm to Dr. Zenovia Jarred , who verbally acknowledged these results. Electronically Signed   By: Kristine Garbe M.D.   On: 05/02/2017 19:33    Microbiology: No results found for this or any previous visit (from the past 240 hour(s)).   Labs: Basic Metabolic Panel:  Recent Labs Lab 05/02/17 1905 05/02/17 1916 05/02/17 2225 05/03/17 0323 05/05/17 0345  NA 135 139  --  139 139  K 3.6 3.8  --  3.5 3.9  CL 104 104  --  107 108  CO2 23  --   --  25 24  GLUCOSE 118* 116*  --  115* 81  BUN 14 16  --  11 11  CREATININE 0.88 0.80  --  0.75 0.72  CALCIUM 9.0  --   --  8.7* 8.7*  MG  --   --  1.6* 1.8  --   PHOS  --   --  2.8 3.3  --    Liver Function Tests:  Recent Labs Lab 05/02/17 1905 05/03/17 0323  AST 57* 33  ALT 25 26  ALKPHOS 102 91  BILITOT 0.2* 0.3  PROT 5.8* 5.5*  ALBUMIN 3.5 3.0*   No results for input(s): LIPASE, AMYLASE in the last 168 hours. No results for input(s): AMMONIA in the last 168  hours. CBC:  Recent Labs Lab 05/02/17 1905 05/02/17 1916 05/03/17 0323 05/05/17 0345  WBC 11.2*  --  8.6 5.6  NEUTROABS 9.8*  --   --   --   HGB 13.2 14.3 12.2 12.6  HCT 39.6 42.0 36.5 37.9  MCV 81.3  --  80.6 81.3  PLT 255  --  229 263   Cardiac Enzymes:  Recent Labs Lab 05/02/17 2225 05/03/17 0323 05/03/17 0821  TROPONINI <0.03 <0.03 0.04*    CBG:  Recent Labs Lab 05/02/17 1908  GLUCAP 119*       Signed:  Oswald Hillock MD.  [facility] 05/05/2017, 3:29 PM

## 2017-05-05 NOTE — Consult Note (Signed)
   [facility] CM Primary Care Navigator  05/05/2017  Emily Murray [birth year] [id number]    Met with patient, ex-husband and son Emily Murray) at the bedside to identify possibledischarge needs. Patient reports having pain to right shoulder down to right arm and feeling numbness to right side of her face which had led to this admission.  Patient end[facility] provider.  [facility] and [facility] to obtain medications without any problem. Patient states realizing not to wait for medications to get low before obtaining prescriptions to be sent to pharmacy in order to refill her medications. She reports managing her ownmedications at home straight out of the containers.  Patient reports that she drives prior to admission. If needed, her husband Emily Murray) or son can provide transportationto her doctors'appointments after discharge.   Patient's husband will not be able to assist with care at home per patient. Her son (living nearby) will be providing assistance with care needs when needed.    Anticipated discharge plan is home per patient.  Patient expressed understanding to call primary care provider's office when she returns home to schedule a post discharge follow-up appointment within a week or sooner if needs arise. Patient letter (with PCP's contact number) was provided as a reminder.  Patient denies any current issues,concerns or healthmanagement needs at this time. She declines EMMI calls to follow-up with recovery at home.  [facility] care management information provided for any future needs that she may have.  For additional questions please contact:  Emily Murray, BSN, RN-BC [facility] PRIMARY CARE Navigator Cell: [phone]

## 2017-05-05 NOTE — Consult Note (Signed)
Late entry: Referring Physician:  ROSIELEE CORPORAN is an 66 y.o. female.                       Chief Complaint: Atypical chest pain, abnormal EKG and Troponin-I.   HPI: 66 year old female was admitted for possible TIA with right sided jaw numbness, shoulder pain, EKG changes of inferior wall ischemia and Troponin-I trending upward. Her Neurological exam and investigations were unrevealing.   Past Medical History:  Diagnosis Date  . Anxiety   . Bipolar 1 disorder (HCC)   . Coronary artery disease   . Depressed   . Hypertension       Past Surgical History:  Procedure Laterality Date  . ABDOMINAL HYSTERECTOMY    . rotator cuff repair Right 2015    Family History  Problem Relation Age of Onset  . Hypertension Mother   . Diabetes Neg Hx   . Cancer Neg Hx   . CAD Neg Hx    Social History:  reports that she has never smoked. She has never used smokeless tobacco. She reports that she does not drink alcohol or use drugs.  Allergies:  Allergies  Allergen Reactions  . Azo Cranberry [Cranberry Extract]     Caused numbness in lip  . Cranberry Other (See Comments)    Patient took Cranberry tablets and her lips "went numb"    Medications Prior to Admission  Medication Sig Dispense Refill  . aspirin EC 325 MG tablet Take 325 mg by mouth once as needed (for arm or chest pain).    . clonazePAM (KLONOPIN) 0.5 MG tablet Take 0.5 mg by mouth daily as needed for anxiety. For aneixty     . estrogens, conjugated, (PREMARIN) 0.625 MG tablet Take 0.625 mg by mouth daily. Take daily for 21 days then do not take for 7 days.    . eszopiclone (LUNESTA) 2 MG TABS tablet Take 2 mg by mouth at bedtime as needed.    . folic acid (FOLVITE) 1 MG tablet Take 1 mg by mouth daily.    Marland Kitchen HYDROcodone-acetaminophen (NORCO/VICODIN) 5-325 MG tablet Take 0.5-1 tablets by mouth every morning.     . lamoTRIgine (LAMICTAL) 100 MG tablet Take 100 mg by mouth every morning.    Marland Kitchen levothyroxine (SYNTHROID,  LEVOTHROID) 150 MCG tablet Take 150 mcg by mouth daily before breakfast.    . sertraline (ZOLOFT) 100 MG tablet Take 200 mg by mouth daily after breakfast.      . Vitamin D, Ergocalciferol, (DRISDOL) 50000 UNITS CAPS capsule Take 50,000 Units by mouth every 7 (seven) days.    . cyclobenzaprine (FLEXERIL) 10 MG tablet Take 0.5 tablets (5 mg total) by mouth 2 (two) times daily as needed for muscle spasms. (Patient not taking: Reported on 05/02/2017) 20 tablet 0  . olmesartan-hydrochlorothiazide (BENICAR HCT) 40-12.5 MG per tablet Take 1 tablet by mouth daily.        Results for orders placed or performed during the hospital encounter of 05/02/17 (from the past 48 hour(s))  Troponin I (q 6hr x 3)     Status: Abnormal   Collection Time: 05/03/17  8:21 AM  Result Value Ref Range   Troponin I 0.04 (HH) <0.03 ng/mL    Comment: CRITICAL RESULT CALLED TO, READ BACK BY AND VERIFIED WITH: J.MILAM,RN 05/03/17 0933 BY BSLADE   Urine rapid drug screen (hosp performed)     Status: Abnormal   Collection Time: 05/03/17  5:27 PM  Result Value  Ref Range   Opiates POSITIVE (A) NONE DETECTED   Cocaine POSITIVE (A) NONE DETECTED   Benzodiazepines NONE DETECTED NONE DETECTED   Amphetamines NONE DETECTED NONE DETECTED   Tetrahydrocannabinol NONE DETECTED NONE DETECTED   Barbiturates NONE DETECTED NONE DETECTED    Comment:        DRUG SCREEN FOR MEDICAL PURPOSES ONLY.  IF CONFIRMATION IS NEEDED FOR ANY PURPOSE, NOTIFY LAB WITHIN 5 DAYS.        LOWEST DETECTABLE LIMITS FOR URINE DRUG SCREEN Drug Class       Cutoff (ng/mL) Amphetamine      1000 Barbiturate      200 Benzodiazepine   200 Tricyclics       300 Opiates          300 Cocaine          300 THC              50   CBC     Status: Abnormal   Collection Time: 05/05/17  3:45 AM  Result Value Ref Range   WBC 5.6 4.0 - 10.5 K/uL   RBC 4.66 3.87 - 5.11 MIL/uL   Hemoglobin 12.6 12.0 - 15.0 g/dL   HCT 76.7 37.8 - 45.3 %   MCV 81.3 78.0 - 100.0 fL    MCH 27.0 26.0 - 34.0 pg   MCHC 33.2 30.0 - 36.0 g/dL   RDW 06.3 (H) 16.7 - 77.3 %   Platelets 263 150 - 400 K/uL  Basic metabolic panel     Status: Abnormal   Collection Time: 05/05/17  3:45 AM  Result Value Ref Range   Sodium 139 135 - 145 mmol/L   Potassium 3.9 3.5 - 5.1 mmol/L   Chloride 108 101 - 111 mmol/L   CO2 24 22 - 32 mmol/L   Glucose, Bld 81 65 - 99 mg/dL   BUN 11 6 - 20 mg/dL   Creatinine, Ser 1.79 0.44 - 1.00 mg/dL   Calcium 8.7 (L) 8.9 - 10.3 mg/dL   GFR calc non Af Amer >60 >60 mL/min   GFR calc Af Amer >60 >60 mL/min    Comment: (NOTE) The eGFR has been calculated using the CKD EPI equation. This calculation has not been validated in all clinical situations. eGFR's persistently <60 mL/min signify possible Chronic Kidney Disease.    Anion gap 7 5 - 15  Protime-INR     Status: None   Collection Time: 05/05/17  3:45 AM  Result Value Ref Range   Prothrombin Time 12.9 11.4 - 15.2 seconds   INR 0.97    Mr Lumbar Spine Wo Contrast  Result Date: 05/03/2017 CLINICAL DATA:  66 y/o F; Back pain, < 6wks, no red flags, no prior management. EXAM: MRI LUMBAR SPINE WITHOUT CONTRAST TECHNIQUE: Multiplanar, multisequence MR imaging of the lumbar spine was performed. No intravenous contrast was administered. COMPARISON:  05/02/2017 abdomen radiograph.  12/18/2015 lumbar MRI. FINDINGS: Segmentation:  Standard. Alignment:  Stable minimal grade 1 L4-5 anterolisthesis. Vertebrae: Mild bilateral facet edema and effusions at the L4-5 level, similar to prior MRI, likely degenerative. No evidence for discitis or acute fracture. Conus medullaris: Extends to the L2 level and appears normal. Stable S2 level Tarlov cysts. Paraspinal and other soft tissues: Negative. Disc levels: L1-2: No significant disc displacement, foraminal narrowing, or canal stenosis. L2-3: Small disc bulge and mild facet hypertrophy. No significant foraminal or canal stenosis. L3-4: Small disc bulge eccentric to the left with  mild facet hypertrophy and trace  degenerative facet effusions. Mild left foraminal and lateral recess stenosis. No significant canal stenosis. L4-5: Anterolisthesis with uncovered disc bulge, advanced facet hypertrophy, and mild ligamentum flavum hypertrophy. Mild left-greater-than-right foraminal stenosis. Mild canal stenosis. L5-S1: Small disc bulge with marginal osteophytes eccentric to the right and mild facet hypertrophy. Mild right-greater-than-left foraminal stenosis. No significant canal stenosis. IMPRESSION: 1. Stable grade 1 L4-5 anterolisthesis. 2. Stable L4-5 facet edema and small effusions, likely degenerative. 3. Stable lumbar degenerative changes greatest at the L4-5 level with there is mild canal stenosis. 4. Multilevel mild foraminal stenosis. No high-grade foraminal stenosis. Electronically Signed   By: Kristine Garbe M.D.   On: 05/03/2017 13:38    Review Of Systems Constitutional: No fever, chills, weight loss or gain. Eyes: No vision change, wears glasses. No discharge or pain. Ears: No hearing loss, No tinnitus. Respiratory: No asthma, COPD, pneumonias. No shortness of breath. No hemoptysis. Cardiovascular: Positive chest pain, no palpitation, leg edema. Gastrointestinal: No nausea, vomiting, diarrhea, constipation. No GI bleed. No hepatitis. Genitourinary: No dysuria, hematuria, kidney stone. No incontinance. Neurological: No headache, stroke, seizures.  Psychiatry: No psych facility admission for anxiety, depression, suicide. No detox. Skin: No rash. Musculoskeletal: Positive joint pain, no fibromyalgia. No neck pain, back pain. Lymphadenopathy: No lymphadenopathy. Hematology: No anemia or easy bruising.   Blood pressure 140/69, pulse 66, temperature 98.1 F (36.7 C), temperature source Oral, resp. rate 16, height _0  (1.6 m), weight 74.9 kg (165 lb 1.6 oz), SpO2 100 %. Body mass index is 29.25 kg/m. General appearance: alert, cooperative, appears stated age  and no distress Head: Normocephalic, atraumatic. Eyes: Brown eyes, pink conjunctiva, corneas clear. PERRL, EOM's intact. Neck: No adenopathy, no carotid bruit, no JVD, supple, symmetrical, trachea midline and thyroid not enlarged. Resp: Clear to auscultation bilaterally. Cardio: Regular rate and rhythm, S1, S2 normal, II/VI systolic murmur, no click, rub or gallop GI: Soft, non-tender; bowel sounds normal; no organomegaly. Extremities: No edema, cyanosis or clubbing. 2 + femoral pulses. Skin: Warm and dry.  Neurologic: Alert and oriented X 3, normal strength. Normal coordination and gait.  Assessment/Plan Atypical chest pain r/o CAD Hypertension Hypothyroidism Bipolar disorder  Cardiac cath v/s nuclear stress test. Patient understood procedure, risks and alternatives and wants to proceed with cardiac catheterization.   Birdie Riddle, MD  05/05/2017, 6:57 AM

## 2017-05-05 NOTE — Interval H&P Note (Signed)
History and Physical Interval Note:  05/05/2017 7:45 AM  Emily Murray  has presented today for surgery, with the diagnosis of cp  The various methods of treatment have been discussed with the patient and family. After consideration of risks, benefits and other options for treatment, the patient has consented to  Procedure(s): LEFT HEART CATH AND CORONARY ANGIOGRAPHY (N/A) as a surgical intervention .  The patient's history has been reviewed, patient examined, no change in status, stable for surgery.  I have reviewed the patient's chart and labs.  Questions were answered to the patient's satisfaction.     [name] S

## 2017-05-18 DIAGNOSIS — I2510 Atherosclerotic heart disease of native coronary artery without angina pectoris: Secondary | ICD-10-CM | POA: Diagnosis not present

## 2017-05-18 DIAGNOSIS — I10 Essential (primary) hypertension: Secondary | ICD-10-CM | POA: Diagnosis not present

## 2017-05-18 DIAGNOSIS — G458 Other transient cerebral ischemic attacks and related syndromes: Secondary | ICD-10-CM | POA: Diagnosis not present

## 2017-05-19 DIAGNOSIS — E039 Hypothyroidism, unspecified: Secondary | ICD-10-CM | POA: Diagnosis not present

## 2017-05-19 DIAGNOSIS — G4700 Insomnia, unspecified: Secondary | ICD-10-CM | POA: Diagnosis not present

## 2017-05-19 DIAGNOSIS — R809 Proteinuria, unspecified: Secondary | ICD-10-CM | POA: Diagnosis not present

## 2017-05-19 DIAGNOSIS — E559 Vitamin D deficiency, unspecified: Secondary | ICD-10-CM | POA: Diagnosis not present

## 2017-05-19 DIAGNOSIS — Z79891 Long term (current) use of opiate analgesic: Secondary | ICD-10-CM | POA: Diagnosis not present

## 2017-05-19 DIAGNOSIS — N644 Mastodynia: Secondary | ICD-10-CM | POA: Diagnosis not present

## 2017-05-19 DIAGNOSIS — Z780 Asymptomatic menopausal state: Secondary | ICD-10-CM | POA: Diagnosis not present

## 2017-05-19 DIAGNOSIS — Z8249 Family history of ischemic heart disease and other diseases of the circulatory system: Secondary | ICD-10-CM | POA: Diagnosis not present

## 2017-05-19 DIAGNOSIS — Z79899 Other long term (current) drug therapy: Secondary | ICD-10-CM | POA: Diagnosis not present

## 2017-05-19 DIAGNOSIS — M25561 Pain in right knee: Secondary | ICD-10-CM | POA: Diagnosis not present

## 2017-05-19 DIAGNOSIS — I119 Hypertensive heart disease without heart failure: Secondary | ICD-10-CM | POA: Diagnosis not present

## 2017-05-19 DIAGNOSIS — M2550 Pain in unspecified joint: Secondary | ICD-10-CM | POA: Diagnosis not present

## 2017-05-20 DIAGNOSIS — S0990XA Unspecified injury of head, initial encounter: Secondary | ICD-10-CM | POA: Diagnosis not present

## 2017-05-20 DIAGNOSIS — S022XXA Fracture of nasal bones, initial encounter for closed fracture: Secondary | ICD-10-CM | POA: Diagnosis not present

## 2017-05-20 DIAGNOSIS — W208XXA Other cause of strike by thrown, projected or falling object, initial encounter: Secondary | ICD-10-CM | POA: Diagnosis not present

## 2017-05-26 DIAGNOSIS — S022XXD Fracture of nasal bones, subsequent encounter for fracture with routine healing: Secondary | ICD-10-CM | POA: Diagnosis not present

## 2017-05-26 DIAGNOSIS — Z8249 Family history of ischemic heart disease and other diseases of the circulatory system: Secondary | ICD-10-CM | POA: Diagnosis not present

## 2017-05-26 DIAGNOSIS — M25561 Pain in right knee: Secondary | ICD-10-CM | POA: Diagnosis not present

## 2017-05-26 DIAGNOSIS — Z79899 Other long term (current) drug therapy: Secondary | ICD-10-CM | POA: Diagnosis not present

## 2017-05-26 DIAGNOSIS — M2550 Pain in unspecified joint: Secondary | ICD-10-CM | POA: Diagnosis not present

## 2017-05-26 DIAGNOSIS — E559 Vitamin D deficiency, unspecified: Secondary | ICD-10-CM | POA: Diagnosis not present

## 2017-05-26 DIAGNOSIS — Z780 Asymptomatic menopausal state: Secondary | ICD-10-CM | POA: Diagnosis not present

## 2017-05-26 DIAGNOSIS — G4700 Insomnia, unspecified: Secondary | ICD-10-CM | POA: Diagnosis not present

## 2017-05-26 DIAGNOSIS — E039 Hypothyroidism, unspecified: Secondary | ICD-10-CM | POA: Diagnosis not present

## 2017-05-26 DIAGNOSIS — I119 Hypertensive heart disease without heart failure: Secondary | ICD-10-CM | POA: Diagnosis not present

## 2017-05-26 DIAGNOSIS — Z79891 Long term (current) use of opiate analgesic: Secondary | ICD-10-CM | POA: Diagnosis not present

## 2017-05-26 DIAGNOSIS — R809 Proteinuria, unspecified: Secondary | ICD-10-CM | POA: Diagnosis not present

## 2017-05-27 DIAGNOSIS — S022XXA Fracture of nasal bones, initial encounter for closed fracture: Secondary | ICD-10-CM | POA: Diagnosis not present

## 2017-06-16 DIAGNOSIS — F419 Anxiety disorder, unspecified: Secondary | ICD-10-CM | POA: Diagnosis not present

## 2017-06-16 DIAGNOSIS — M5432 Sciatica, left side: Secondary | ICD-10-CM | POA: Diagnosis not present

## 2017-07-14 DIAGNOSIS — Z1231 Encounter for screening mammogram for malignant neoplasm of breast: Secondary | ICD-10-CM | POA: Diagnosis not present

## 2017-07-21 DIAGNOSIS — M2550 Pain in unspecified joint: Secondary | ICD-10-CM | POA: Diagnosis not present

## 2017-07-21 DIAGNOSIS — Z79891 Long term (current) use of opiate analgesic: Secondary | ICD-10-CM | POA: Diagnosis not present

## 2017-07-21 DIAGNOSIS — M25561 Pain in right knee: Secondary | ICD-10-CM | POA: Diagnosis not present

## 2017-07-21 DIAGNOSIS — Z79899 Other long term (current) drug therapy: Secondary | ICD-10-CM | POA: Diagnosis not present

## 2017-07-21 DIAGNOSIS — Z23 Encounter for immunization: Secondary | ICD-10-CM | POA: Diagnosis not present

## 2017-07-21 DIAGNOSIS — G4700 Insomnia, unspecified: Secondary | ICD-10-CM | POA: Diagnosis not present

## 2017-07-21 DIAGNOSIS — I119 Hypertensive heart disease without heart failure: Secondary | ICD-10-CM | POA: Diagnosis not present

## 2017-07-21 DIAGNOSIS — Z8249 Family history of ischemic heart disease and other diseases of the circulatory system: Secondary | ICD-10-CM | POA: Diagnosis not present

## 2017-07-21 DIAGNOSIS — E559 Vitamin D deficiency, unspecified: Secondary | ICD-10-CM | POA: Diagnosis not present

## 2017-07-21 DIAGNOSIS — E039 Hypothyroidism, unspecified: Secondary | ICD-10-CM | POA: Diagnosis not present

## 2017-07-21 DIAGNOSIS — R809 Proteinuria, unspecified: Secondary | ICD-10-CM | POA: Diagnosis not present

## 2017-07-21 DIAGNOSIS — Z780 Asymptomatic menopausal state: Secondary | ICD-10-CM | POA: Diagnosis not present

## 2017-08-10 DIAGNOSIS — F312 Bipolar disorder, current episode manic severe with psychotic features: Secondary | ICD-10-CM | POA: Diagnosis not present

## 2017-09-15 DIAGNOSIS — E039 Hypothyroidism, unspecified: Secondary | ICD-10-CM | POA: Diagnosis not present

## 2017-09-15 DIAGNOSIS — M2550 Pain in unspecified joint: Secondary | ICD-10-CM | POA: Diagnosis not present

## 2017-09-15 DIAGNOSIS — R809 Proteinuria, unspecified: Secondary | ICD-10-CM | POA: Diagnosis not present

## 2017-09-15 DIAGNOSIS — M25561 Pain in right knee: Secondary | ICD-10-CM | POA: Diagnosis not present

## 2017-09-15 DIAGNOSIS — Z780 Asymptomatic menopausal state: Secondary | ICD-10-CM | POA: Diagnosis not present

## 2017-09-15 DIAGNOSIS — Z79891 Long term (current) use of opiate analgesic: Secondary | ICD-10-CM | POA: Diagnosis not present

## 2017-09-15 DIAGNOSIS — G4700 Insomnia, unspecified: Secondary | ICD-10-CM | POA: Diagnosis not present

## 2017-09-15 DIAGNOSIS — M159 Polyosteoarthritis, unspecified: Secondary | ICD-10-CM | POA: Diagnosis not present

## 2017-09-15 DIAGNOSIS — Z8249 Family history of ischemic heart disease and other diseases of the circulatory system: Secondary | ICD-10-CM | POA: Diagnosis not present

## 2017-09-15 DIAGNOSIS — Z79899 Other long term (current) drug therapy: Secondary | ICD-10-CM | POA: Diagnosis not present

## 2017-09-15 DIAGNOSIS — E559 Vitamin D deficiency, unspecified: Secondary | ICD-10-CM | POA: Diagnosis not present

## 2017-09-15 DIAGNOSIS — I119 Hypertensive heart disease without heart failure: Secondary | ICD-10-CM | POA: Diagnosis not present

## 2017-12-15 DIAGNOSIS — Z79899 Other long term (current) drug therapy: Secondary | ICD-10-CM | POA: Diagnosis not present

## 2017-12-15 DIAGNOSIS — G4700 Insomnia, unspecified: Secondary | ICD-10-CM | POA: Diagnosis not present

## 2017-12-15 DIAGNOSIS — I119 Hypertensive heart disease without heart failure: Secondary | ICD-10-CM | POA: Diagnosis not present

## 2017-12-15 DIAGNOSIS — M25561 Pain in right knee: Secondary | ICD-10-CM | POA: Diagnosis not present

## 2017-12-15 DIAGNOSIS — E559 Vitamin D deficiency, unspecified: Secondary | ICD-10-CM | POA: Diagnosis not present

## 2017-12-15 DIAGNOSIS — Z780 Asymptomatic menopausal state: Secondary | ICD-10-CM | POA: Diagnosis not present

## 2017-12-15 DIAGNOSIS — M159 Polyosteoarthritis, unspecified: Secondary | ICD-10-CM | POA: Diagnosis not present

## 2017-12-15 DIAGNOSIS — M2550 Pain in unspecified joint: Secondary | ICD-10-CM | POA: Diagnosis not present

## 2017-12-15 DIAGNOSIS — R809 Proteinuria, unspecified: Secondary | ICD-10-CM | POA: Diagnosis not present

## 2017-12-15 DIAGNOSIS — E039 Hypothyroidism, unspecified: Secondary | ICD-10-CM | POA: Diagnosis not present

## 2017-12-15 DIAGNOSIS — Z8249 Family history of ischemic heart disease and other diseases of the circulatory system: Secondary | ICD-10-CM | POA: Diagnosis not present

## 2017-12-15 DIAGNOSIS — Z79891 Long term (current) use of opiate analgesic: Secondary | ICD-10-CM | POA: Diagnosis not present

## 2018-01-10 DIAGNOSIS — F312 Bipolar disorder, current episode manic severe with psychotic features: Secondary | ICD-10-CM | POA: Diagnosis not present

## 2018-03-14 DIAGNOSIS — Z79891 Long term (current) use of opiate analgesic: Secondary | ICD-10-CM | POA: Diagnosis not present

## 2018-03-14 DIAGNOSIS — E559 Vitamin D deficiency, unspecified: Secondary | ICD-10-CM | POA: Diagnosis not present

## 2018-03-14 DIAGNOSIS — G4700 Insomnia, unspecified: Secondary | ICD-10-CM | POA: Diagnosis not present

## 2018-03-14 DIAGNOSIS — I119 Hypertensive heart disease without heart failure: Secondary | ICD-10-CM | POA: Diagnosis not present

## 2018-03-14 DIAGNOSIS — Z8249 Family history of ischemic heart disease and other diseases of the circulatory system: Secondary | ICD-10-CM | POA: Diagnosis not present

## 2018-03-14 DIAGNOSIS — E039 Hypothyroidism, unspecified: Secondary | ICD-10-CM | POA: Diagnosis not present

## 2018-03-14 DIAGNOSIS — R809 Proteinuria, unspecified: Secondary | ICD-10-CM | POA: Diagnosis not present

## 2018-03-14 DIAGNOSIS — Z780 Asymptomatic menopausal state: Secondary | ICD-10-CM | POA: Diagnosis not present

## 2018-03-14 DIAGNOSIS — M2550 Pain in unspecified joint: Secondary | ICD-10-CM | POA: Diagnosis not present

## 2018-03-14 DIAGNOSIS — M25561 Pain in right knee: Secondary | ICD-10-CM | POA: Diagnosis not present

## 2018-03-14 DIAGNOSIS — Z79899 Other long term (current) drug therapy: Secondary | ICD-10-CM | POA: Diagnosis not present

## 2018-05-08 DIAGNOSIS — M25561 Pain in right knee: Secondary | ICD-10-CM | POA: Diagnosis not present

## 2018-05-08 DIAGNOSIS — Z780 Asymptomatic menopausal state: Secondary | ICD-10-CM | POA: Diagnosis not present

## 2018-05-08 DIAGNOSIS — G4700 Insomnia, unspecified: Secondary | ICD-10-CM | POA: Diagnosis not present

## 2018-05-08 DIAGNOSIS — E039 Hypothyroidism, unspecified: Secondary | ICD-10-CM | POA: Diagnosis not present

## 2018-05-08 DIAGNOSIS — M159 Polyosteoarthritis, unspecified: Secondary | ICD-10-CM | POA: Diagnosis not present

## 2018-05-08 DIAGNOSIS — I119 Hypertensive heart disease without heart failure: Secondary | ICD-10-CM | POA: Diagnosis not present

## 2018-05-08 DIAGNOSIS — E559 Vitamin D deficiency, unspecified: Secondary | ICD-10-CM | POA: Diagnosis not present

## 2018-05-08 DIAGNOSIS — M2550 Pain in unspecified joint: Secondary | ICD-10-CM | POA: Diagnosis not present

## 2018-05-08 DIAGNOSIS — Z79899 Other long term (current) drug therapy: Secondary | ICD-10-CM | POA: Diagnosis not present

## 2018-05-08 DIAGNOSIS — R809 Proteinuria, unspecified: Secondary | ICD-10-CM | POA: Diagnosis not present

## 2018-05-08 DIAGNOSIS — Z79891 Long term (current) use of opiate analgesic: Secondary | ICD-10-CM | POA: Diagnosis not present

## 2018-05-08 DIAGNOSIS — Z8249 Family history of ischemic heart disease and other diseases of the circulatory system: Secondary | ICD-10-CM | POA: Diagnosis not present

## 2018-05-30 DIAGNOSIS — I10 Essential (primary) hypertension: Secondary | ICD-10-CM | POA: Diagnosis not present

## 2018-06-16 IMAGING — CT CT HEAD CODE STROKE
4 series · 16 of 47 positions shown, 18 images · non-contrast
Comparison: 08/23/2011 CT head

CLINICAL DATA: Code stroke. 66 y/o F; right-sided tingling,
right-sided facial droop, aphasia.

EXAM:
CT HEAD WITHOUT CONTRAST
TECHNIQUE: Contiguous axial images were obtained from the base of the skull
through the vertex without intravenous contrast.

[Series 3: head wo · axial · 0.41mm/px · z∈[+1314,+1434]mm · 7 of 32 slices shown, 9 images]
[im 4/32  brain]
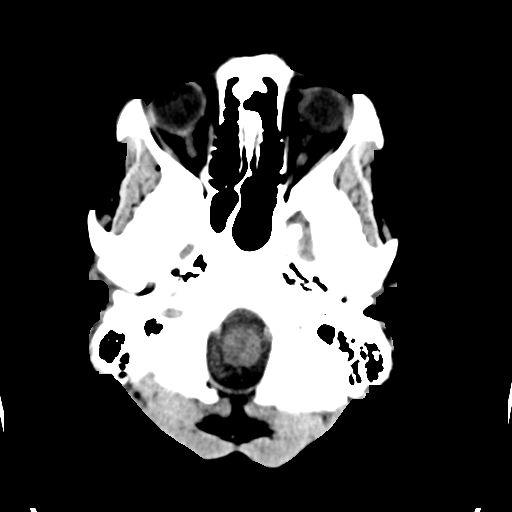
[im 4/32  bone]
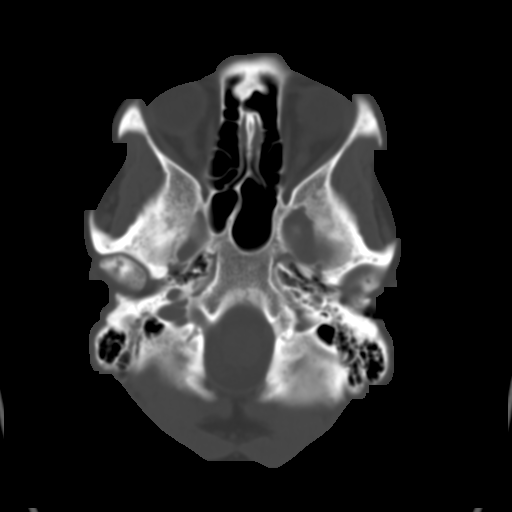
[im 8/32  brain]
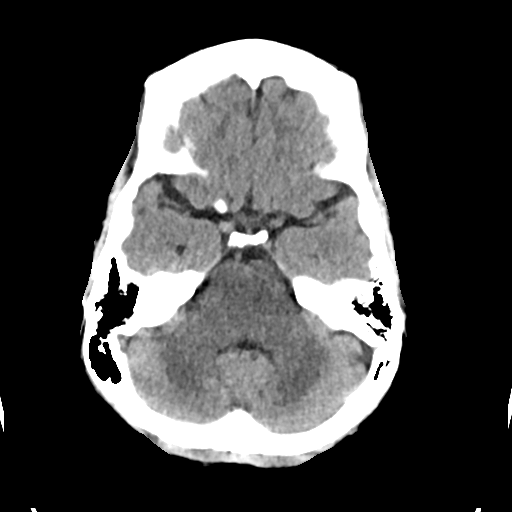
[im 12/32  brain]
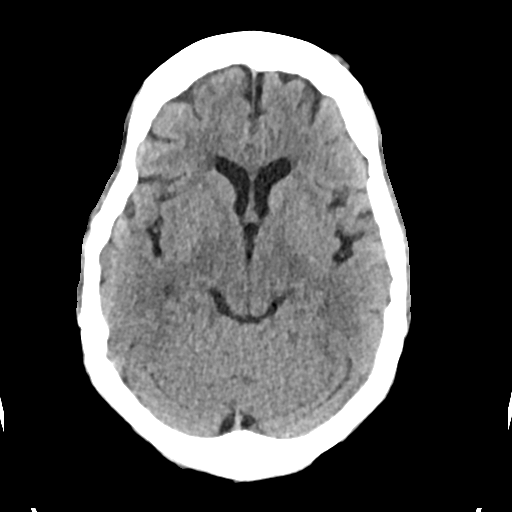
[im 16/32  brain]
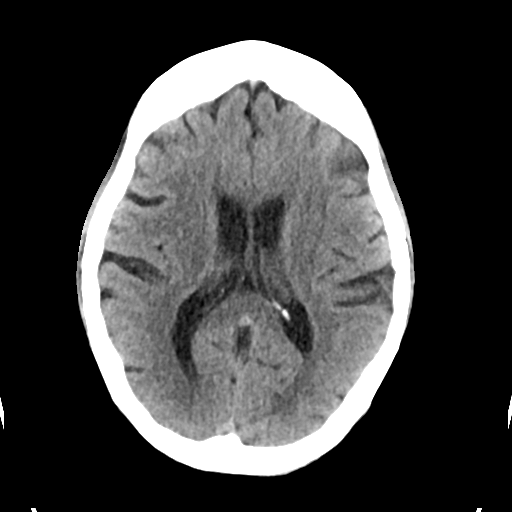
[im 20/32  brain]
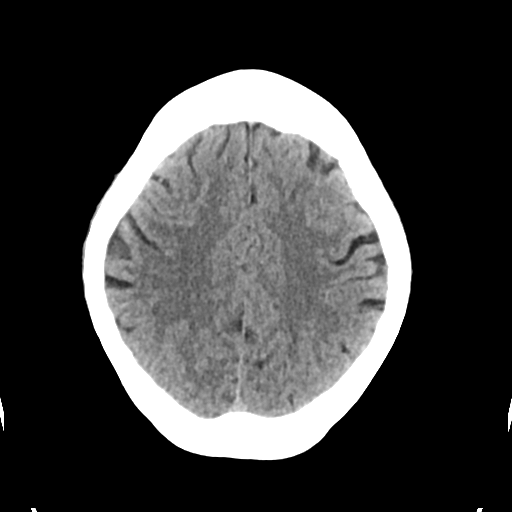
[im 20/32  bone]
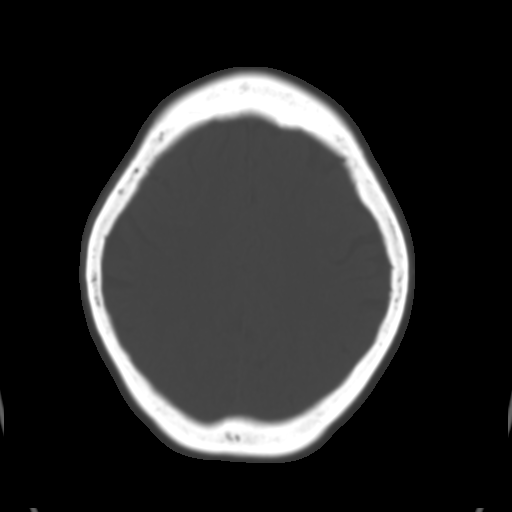
[im 24/32  brain]
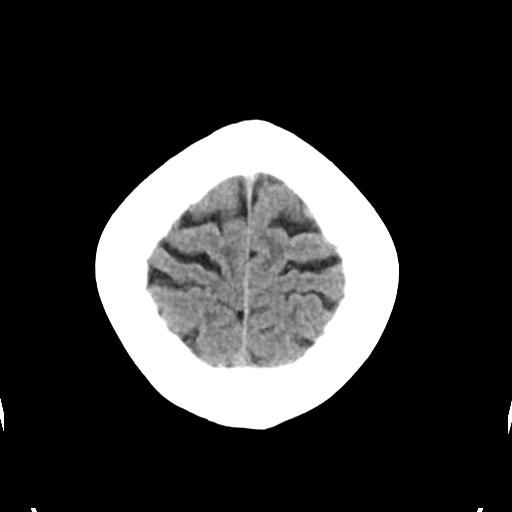
[im 28/32  brain]
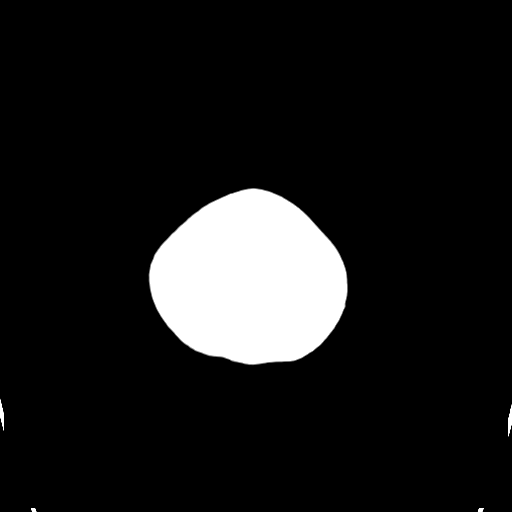

[Series 4: head bone · axial · 0.41mm/px · z∈[+1312,+1344]mm · 3 of 79 slices shown]
[im 8/79  bone]
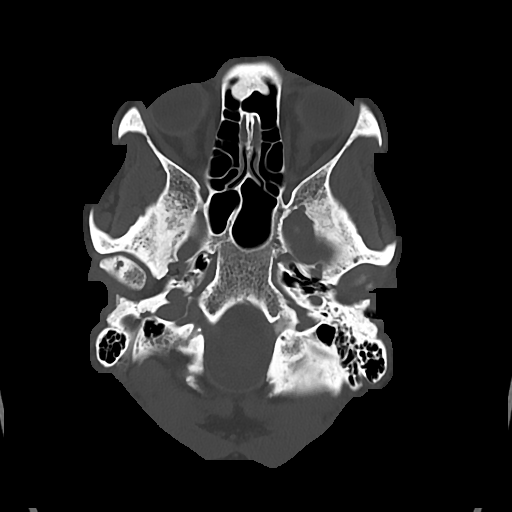
[im 16/79  bone]
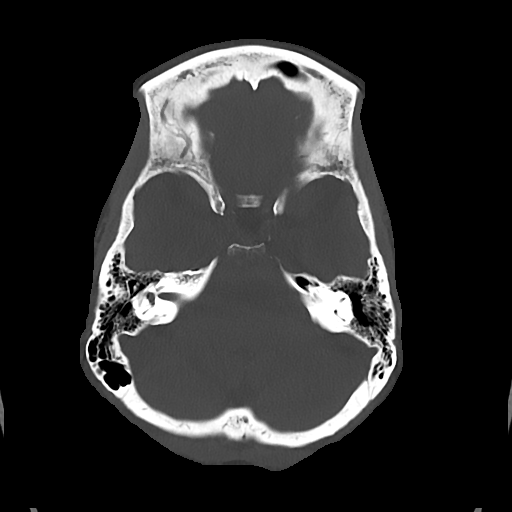
[im 24/79  bone]
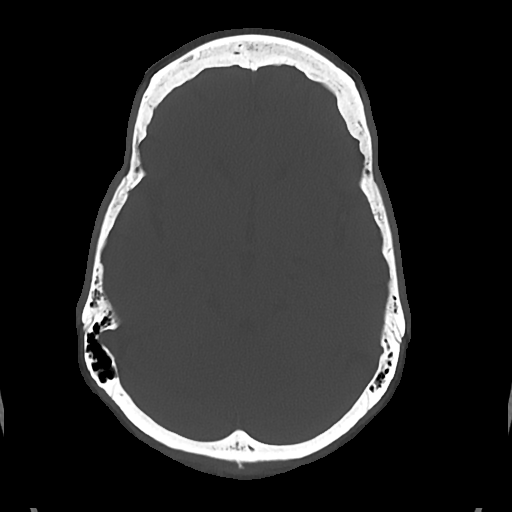

[Series 5: cor soft · coronal · 0.32mm/px · 3 of 67 slices shown]
[im 23/67  brain]
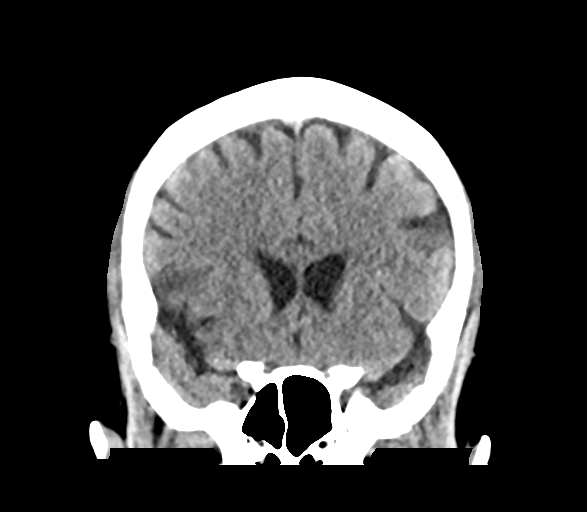
[im 30/67  brain]
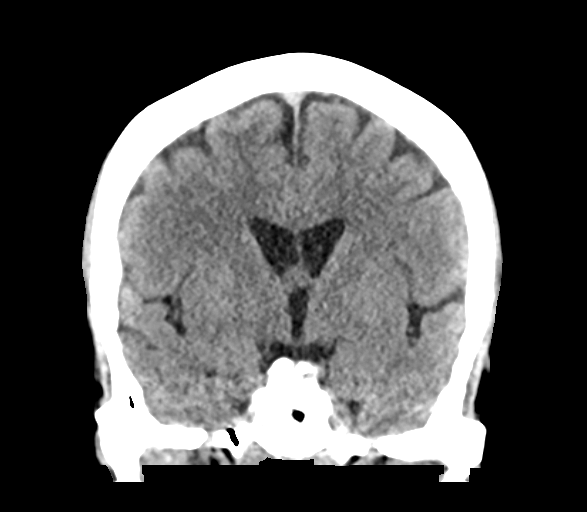
[im 37/67  brain]
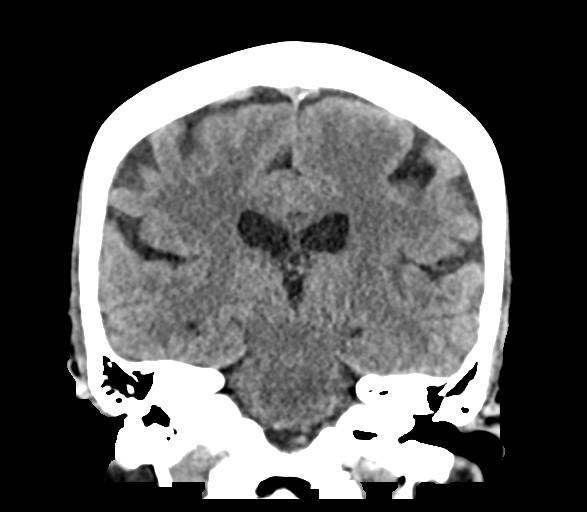

[Series 6: sag soft · sagittal · 0.32mm/px · 3 of 57 slices shown]
[im 19/57  brain]
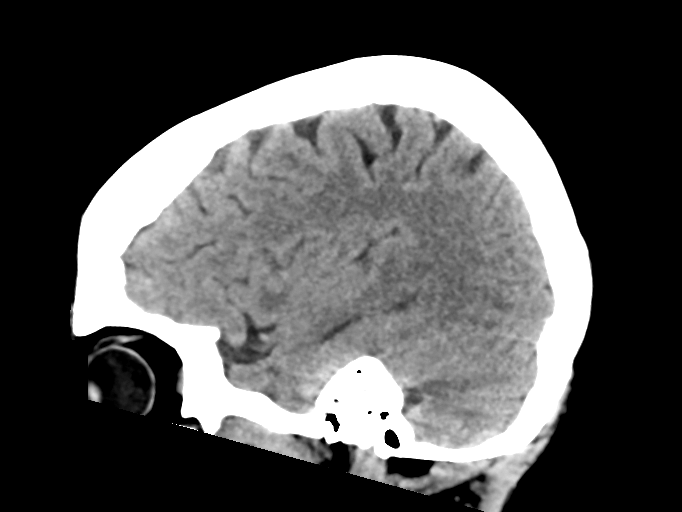
[im 29/57  brain]
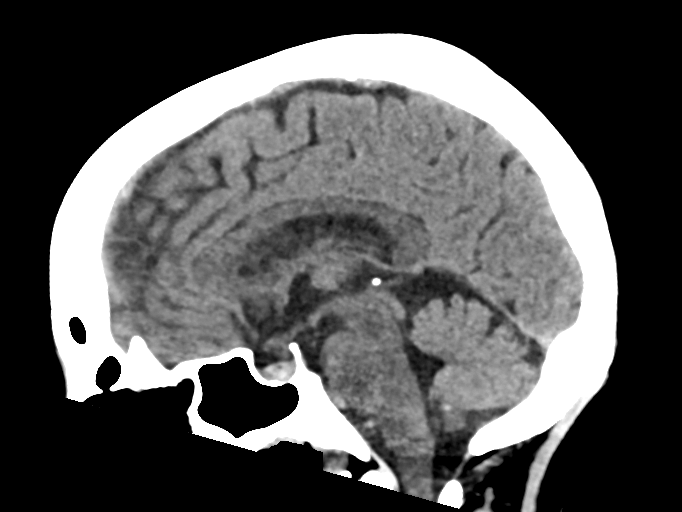
[im 38/57  brain]
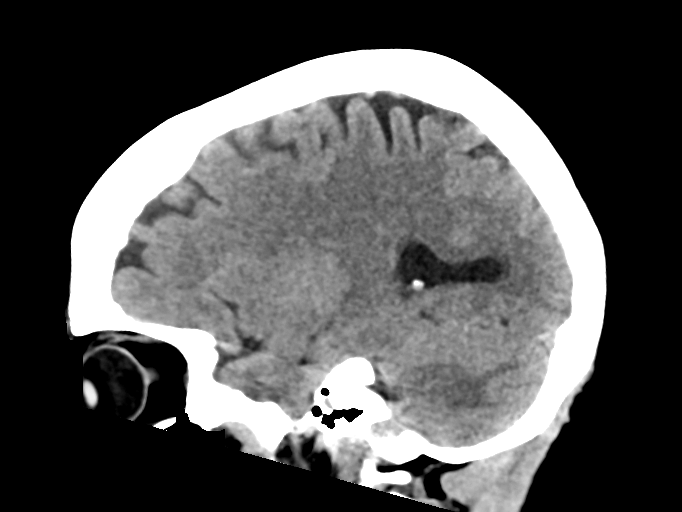

[16 of 47 positions shown; findings below may reference images not displayed]

FINDINGS: Brain: No evidence of acute infarction, hemorrhage, hydrocephalus,
extra-axial collection or mass lesion/mass effect.

Vascular: No hyperdense vessel identified. Mild calcific
atherosclerosis of carotid siphons.

Skull: Normal. Negative for fracture or focal lesion.

Sinuses/Orbits: No acute finding.

Other: None.

ASPECTS (Alberta Stroke Program Early CT Score)

- Ganglionic level infarction (caudate, lentiform nuclei, internal
capsule, insula, M1-M3 cortex): 7

- Supraganglionic infarction (M4-M6 cortex): 3

Total score (0-10 with 10 being normal): 10
IMPRESSION: 1. No acute intracranial abnormality identified.
2. ASPECTS is 10.
These results were called by telephone at the time of interpretation
on 05/02/2017 at [DATE] to Dr. KO HOKE , who verbally
acknowledged these results.

By: Afwaj Alissa M.D.

## 2018-07-12 DIAGNOSIS — G894 Chronic pain syndrome: Secondary | ICD-10-CM | POA: Diagnosis not present

## 2018-07-12 DIAGNOSIS — Z79899 Other long term (current) drug therapy: Secondary | ICD-10-CM | POA: Diagnosis not present

## 2018-07-12 DIAGNOSIS — M25552 Pain in left hip: Secondary | ICD-10-CM | POA: Diagnosis not present

## 2018-07-12 DIAGNOSIS — M25561 Pain in right knee: Secondary | ICD-10-CM | POA: Diagnosis not present

## 2018-07-12 DIAGNOSIS — Z79891 Long term (current) use of opiate analgesic: Secondary | ICD-10-CM | POA: Diagnosis not present

## 2018-08-07 DIAGNOSIS — Z1231 Encounter for screening mammogram for malignant neoplasm of breast: Secondary | ICD-10-CM | POA: Diagnosis not present

## 2018-08-09 DIAGNOSIS — M2391 Unspecified internal derangement of right knee: Secondary | ICD-10-CM | POA: Diagnosis not present

## 2018-08-09 DIAGNOSIS — M25552 Pain in left hip: Secondary | ICD-10-CM | POA: Diagnosis not present

## 2018-08-09 DIAGNOSIS — M25561 Pain in right knee: Secondary | ICD-10-CM | POA: Diagnosis not present

## 2018-08-09 DIAGNOSIS — G894 Chronic pain syndrome: Secondary | ICD-10-CM | POA: Diagnosis not present

## 2018-08-18 ENCOUNTER — Ambulatory Visit (INDEPENDENT_AMBULATORY_CARE_PROVIDER_SITE_OTHER): Payer: Medicare Other | Admitting: Podiatry

## 2018-08-18 ENCOUNTER — Encounter: Payer: Self-pay | Admitting: Podiatry

## 2018-08-18 VITALS — BP 106/68 | HR 63 | Resp 16

## 2018-08-18 DIAGNOSIS — Z23 Encounter for immunization: Secondary | ICD-10-CM | POA: Diagnosis not present

## 2018-08-18 DIAGNOSIS — L600 Ingrowing nail: Secondary | ICD-10-CM

## 2018-08-18 DIAGNOSIS — M21619 Bunion of unspecified foot: Secondary | ICD-10-CM

## 2018-08-18 MED ORDER — NEOMYCIN-POLYMYXIN-HC 3.5-10000-1 OT SOLN
OTIC | 1 refills | Status: DC
Start: 2018-08-18 — End: 2020-11-06

## 2018-08-18 NOTE — Progress Notes (Signed)
   Subjective:    Patient ID: Emily Murray, female    DOB: 1950-12-18, 67 y.o.   MRN: [id number]  HPI    Review of Systems  All other systems reviewed and are negative.      Objective:   Physical Exam        Assessment & Plan:

## 2018-08-18 NOTE — Patient Instructions (Signed)

## 2018-08-20 NOTE — Progress Notes (Signed)
Subjective:   Patient ID: Emily Murray, female   DOB: 67 y.o.   MRN: [id number]   HPI Patient presents with painful ingrown toenail deformity left hallux medial border that is painful and hard for her to wear shoe gear with it also has structural bunion deformity bilateral but some days she may want to get corrected.  Patient does not smoke and likes to be active   Review of Systems  All other systems reviewed and are negative.       Objective:  Physical Exam  Constitutional: She appears well-developed and well-nourished.  Cardiovascular: Intact distal pulses.  Pulmonary/Chest: Effort normal.  Musculoskeletal: Normal range of motion.  Neurological: She is alert.  Skin: Skin is warm.  Nursing note and vitals reviewed.   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  Today I went ahead and explained the surgery and I infiltrated the left hallux 60 mill grams like Marcaine mixture sterile prep applied to the toe and using sterile instrumentation I remove the border exposed matrix and applied phenol 3 applications 30 seconds followed by alcohol lavage sterile dressing.  Discussed bunion we will not correct currently but it may be necessary some day

## 2018-08-31 ENCOUNTER — Other Ambulatory Visit: Payer: Self-pay | Admitting: Pain Medicine

## 2018-08-31 ENCOUNTER — Ambulatory Visit
Admission: RE | Admit: 2018-08-31 | Discharge: 2018-08-31 | Disposition: A | Discharge status: Home / Self Care | Payer: Medicare Other | Source: Ambulatory Visit | Attending: Pain Medicine | Admitting: Pain Medicine

## 2018-08-31 DIAGNOSIS — M25552 Pain in left hip: Secondary | ICD-10-CM | POA: Diagnosis not present

## 2018-09-04 DIAGNOSIS — M2391 Unspecified internal derangement of right knee: Secondary | ICD-10-CM | POA: Diagnosis not present

## 2018-09-04 DIAGNOSIS — G894 Chronic pain syndrome: Secondary | ICD-10-CM | POA: Diagnosis not present

## 2018-09-04 DIAGNOSIS — M25552 Pain in left hip: Secondary | ICD-10-CM | POA: Diagnosis not present

## 2018-09-06 DIAGNOSIS — F312 Bipolar disorder, current episode manic severe with psychotic features: Secondary | ICD-10-CM | POA: Diagnosis not present

## 2018-10-02 DIAGNOSIS — G894 Chronic pain syndrome: Secondary | ICD-10-CM | POA: Diagnosis not present

## 2018-10-02 DIAGNOSIS — Z79899 Other long term (current) drug therapy: Secondary | ICD-10-CM | POA: Diagnosis not present

## 2018-10-02 DIAGNOSIS — M179 Osteoarthritis of knee, unspecified: Secondary | ICD-10-CM | POA: Diagnosis not present

## 2018-10-02 DIAGNOSIS — Z79891 Long term (current) use of opiate analgesic: Secondary | ICD-10-CM | POA: Diagnosis not present

## 2018-10-02 DIAGNOSIS — M25569 Pain in unspecified knee: Secondary | ICD-10-CM | POA: Diagnosis not present

## 2018-10-02 DIAGNOSIS — M25559 Pain in unspecified hip: Secondary | ICD-10-CM | POA: Diagnosis not present

## 2018-10-09 DIAGNOSIS — E559 Vitamin D deficiency, unspecified: Secondary | ICD-10-CM | POA: Diagnosis not present

## 2018-10-09 DIAGNOSIS — Z8249 Family history of ischemic heart disease and other diseases of the circulatory system: Secondary | ICD-10-CM | POA: Diagnosis not present

## 2018-10-09 DIAGNOSIS — Z79899 Other long term (current) drug therapy: Secondary | ICD-10-CM | POA: Diagnosis not present

## 2018-10-09 DIAGNOSIS — M25561 Pain in right knee: Secondary | ICD-10-CM | POA: Diagnosis not present

## 2018-10-09 DIAGNOSIS — E039 Hypothyroidism, unspecified: Secondary | ICD-10-CM | POA: Diagnosis not present

## 2018-10-09 DIAGNOSIS — R809 Proteinuria, unspecified: Secondary | ICD-10-CM | POA: Diagnosis not present

## 2018-10-09 DIAGNOSIS — G4700 Insomnia, unspecified: Secondary | ICD-10-CM | POA: Diagnosis not present

## 2018-10-09 DIAGNOSIS — M2550 Pain in unspecified joint: Secondary | ICD-10-CM | POA: Diagnosis not present

## 2018-10-09 DIAGNOSIS — M159 Polyosteoarthritis, unspecified: Secondary | ICD-10-CM | POA: Diagnosis not present

## 2018-10-09 DIAGNOSIS — I119 Hypertensive heart disease without heart failure: Secondary | ICD-10-CM | POA: Diagnosis not present

## 2018-10-30 DIAGNOSIS — Z79891 Long term (current) use of opiate analgesic: Secondary | ICD-10-CM | POA: Diagnosis not present

## 2018-10-30 DIAGNOSIS — M179 Osteoarthritis of knee, unspecified: Secondary | ICD-10-CM | POA: Diagnosis not present

## 2018-10-30 DIAGNOSIS — G894 Chronic pain syndrome: Secondary | ICD-10-CM | POA: Diagnosis not present

## 2018-10-30 DIAGNOSIS — Z79899 Other long term (current) drug therapy: Secondary | ICD-10-CM | POA: Diagnosis not present

## 2018-10-30 DIAGNOSIS — M25559 Pain in unspecified hip: Secondary | ICD-10-CM | POA: Diagnosis not present

## 2018-11-17 DIAGNOSIS — Z87442 Personal history of urinary calculi: Secondary | ICD-10-CM | POA: Diagnosis not present

## 2018-11-17 DIAGNOSIS — R319 Hematuria, unspecified: Secondary | ICD-10-CM | POA: Diagnosis not present

## 2018-11-17 DIAGNOSIS — R3121 Asymptomatic microscopic hematuria: Secondary | ICD-10-CM | POA: Diagnosis not present

## 2018-11-17 DIAGNOSIS — R109 Unspecified abdominal pain: Secondary | ICD-10-CM | POA: Diagnosis not present

## 2018-11-19 DIAGNOSIS — R1012 Left upper quadrant pain: Secondary | ICD-10-CM | POA: Diagnosis not present

## 2018-11-19 DIAGNOSIS — R1032 Left lower quadrant pain: Secondary | ICD-10-CM | POA: Diagnosis not present

## 2018-11-20 DIAGNOSIS — K5790 Diverticulosis of intestine, part unspecified, without perforation or abscess without bleeding: Secondary | ICD-10-CM | POA: Diagnosis not present

## 2018-12-04 DIAGNOSIS — Z79891 Long term (current) use of opiate analgesic: Secondary | ICD-10-CM | POA: Diagnosis not present

## 2018-12-04 DIAGNOSIS — M179 Osteoarthritis of knee, unspecified: Secondary | ICD-10-CM | POA: Diagnosis not present

## 2018-12-04 DIAGNOSIS — G894 Chronic pain syndrome: Secondary | ICD-10-CM | POA: Diagnosis not present

## 2018-12-04 DIAGNOSIS — M25559 Pain in unspecified hip: Secondary | ICD-10-CM | POA: Diagnosis not present

## 2018-12-04 DIAGNOSIS — Z79899 Other long term (current) drug therapy: Secondary | ICD-10-CM | POA: Diagnosis not present

## 2019-01-12 DIAGNOSIS — M25559 Pain in unspecified hip: Secondary | ICD-10-CM | POA: Diagnosis not present

## 2019-01-12 DIAGNOSIS — M179 Osteoarthritis of knee, unspecified: Secondary | ICD-10-CM | POA: Diagnosis not present

## 2019-01-12 DIAGNOSIS — G894 Chronic pain syndrome: Secondary | ICD-10-CM | POA: Diagnosis not present

## 2019-01-18 DIAGNOSIS — Z79891 Long term (current) use of opiate analgesic: Secondary | ICD-10-CM | POA: Diagnosis not present

## 2019-01-18 DIAGNOSIS — G894 Chronic pain syndrome: Secondary | ICD-10-CM | POA: Diagnosis not present

## 2019-01-18 DIAGNOSIS — Z79899 Other long term (current) drug therapy: Secondary | ICD-10-CM | POA: Diagnosis not present

## 2019-02-08 DIAGNOSIS — Z79891 Long term (current) use of opiate analgesic: Secondary | ICD-10-CM | POA: Diagnosis not present

## 2019-02-08 DIAGNOSIS — Z79899 Other long term (current) drug therapy: Secondary | ICD-10-CM | POA: Diagnosis not present

## 2019-02-08 DIAGNOSIS — M25559 Pain in unspecified hip: Secondary | ICD-10-CM | POA: Diagnosis not present

## 2019-02-08 DIAGNOSIS — M179 Osteoarthritis of knee, unspecified: Secondary | ICD-10-CM | POA: Diagnosis not present

## 2019-02-08 DIAGNOSIS — G894 Chronic pain syndrome: Secondary | ICD-10-CM | POA: Diagnosis not present

## 2019-02-22 DIAGNOSIS — M25559 Pain in unspecified hip: Secondary | ICD-10-CM | POA: Diagnosis not present

## 2019-02-22 DIAGNOSIS — G894 Chronic pain syndrome: Secondary | ICD-10-CM | POA: Diagnosis not present

## 2019-02-22 DIAGNOSIS — M25569 Pain in unspecified knee: Secondary | ICD-10-CM | POA: Diagnosis not present

## 2019-02-22 DIAGNOSIS — Z79891 Long term (current) use of opiate analgesic: Secondary | ICD-10-CM | POA: Diagnosis not present

## 2019-02-22 DIAGNOSIS — M179 Osteoarthritis of knee, unspecified: Secondary | ICD-10-CM | POA: Diagnosis not present

## 2019-02-22 DIAGNOSIS — Z79899 Other long term (current) drug therapy: Secondary | ICD-10-CM | POA: Diagnosis not present

## 2019-03-01 DIAGNOSIS — H40023 Open angle with borderline findings, high risk, bilateral: Secondary | ICD-10-CM | POA: Diagnosis not present

## 2019-03-01 DIAGNOSIS — H25013 Cortical age-related cataract, bilateral: Secondary | ICD-10-CM | POA: Diagnosis not present

## 2019-03-01 DIAGNOSIS — H02403 Unspecified ptosis of bilateral eyelids: Secondary | ICD-10-CM | POA: Diagnosis not present

## 2019-03-01 DIAGNOSIS — H35373 Puckering of macula, bilateral: Secondary | ICD-10-CM | POA: Diagnosis not present

## 2019-03-01 DIAGNOSIS — H2513 Age-related nuclear cataract, bilateral: Secondary | ICD-10-CM | POA: Diagnosis not present

## 2019-03-12 DIAGNOSIS — H40023 Open angle with borderline findings, high risk, bilateral: Secondary | ICD-10-CM | POA: Diagnosis not present

## 2019-03-12 DIAGNOSIS — H35373 Puckering of macula, bilateral: Secondary | ICD-10-CM | POA: Diagnosis not present

## 2019-03-12 DIAGNOSIS — H2513 Age-related nuclear cataract, bilateral: Secondary | ICD-10-CM | POA: Diagnosis not present

## 2019-04-05 DIAGNOSIS — H2512 Age-related nuclear cataract, left eye: Secondary | ICD-10-CM | POA: Diagnosis not present

## 2019-04-19 DIAGNOSIS — H59022 Cataract (lens) fragments in eye following cataract surgery, left eye: Secondary | ICD-10-CM | POA: Diagnosis not present

## 2019-05-13 DIAGNOSIS — H2511 Age-related nuclear cataract, right eye: Secondary | ICD-10-CM | POA: Diagnosis not present

## 2019-05-17 DIAGNOSIS — H2511 Age-related nuclear cataract, right eye: Secondary | ICD-10-CM | POA: Diagnosis not present

## 2019-05-29 DIAGNOSIS — Z7289 Other problems related to lifestyle: Secondary | ICD-10-CM | POA: Diagnosis not present

## 2019-05-29 DIAGNOSIS — H6123 Impacted cerumen, bilateral: Secondary | ICD-10-CM | POA: Diagnosis not present

## 2019-05-29 DIAGNOSIS — H938X1 Other specified disorders of right ear: Secondary | ICD-10-CM | POA: Diagnosis not present

## 2019-05-29 DIAGNOSIS — Z87891 Personal history of nicotine dependence: Secondary | ICD-10-CM | POA: Diagnosis not present

## 2019-05-30 DIAGNOSIS — Z20828 Contact with and (suspected) exposure to other viral communicable diseases: Secondary | ICD-10-CM | POA: Diagnosis not present

## 2019-05-30 DIAGNOSIS — I10 Essential (primary) hypertension: Secondary | ICD-10-CM | POA: Diagnosis not present

## 2019-07-03 DIAGNOSIS — R809 Proteinuria, unspecified: Secondary | ICD-10-CM | POA: Diagnosis not present

## 2019-07-03 DIAGNOSIS — E039 Hypothyroidism, unspecified: Secondary | ICD-10-CM | POA: Diagnosis not present

## 2019-07-03 DIAGNOSIS — I119 Hypertensive heart disease without heart failure: Secondary | ICD-10-CM | POA: Diagnosis not present

## 2019-07-03 DIAGNOSIS — E559 Vitamin D deficiency, unspecified: Secondary | ICD-10-CM | POA: Diagnosis not present

## 2019-07-03 DIAGNOSIS — Z8249 Family history of ischemic heart disease and other diseases of the circulatory system: Secondary | ICD-10-CM | POA: Diagnosis not present

## 2019-07-03 DIAGNOSIS — M79669 Pain in unspecified lower leg: Secondary | ICD-10-CM | POA: Diagnosis not present

## 2019-07-03 DIAGNOSIS — M159 Polyosteoarthritis, unspecified: Secondary | ICD-10-CM | POA: Diagnosis not present

## 2019-07-03 DIAGNOSIS — G4700 Insomnia, unspecified: Secondary | ICD-10-CM | POA: Diagnosis not present

## 2019-07-03 DIAGNOSIS — N951 Menopausal and female climacteric states: Secondary | ICD-10-CM | POA: Diagnosis not present

## 2019-07-03 DIAGNOSIS — M2550 Pain in unspecified joint: Secondary | ICD-10-CM | POA: Diagnosis not present

## 2019-07-03 DIAGNOSIS — Z79899 Other long term (current) drug therapy: Secondary | ICD-10-CM | POA: Diagnosis not present

## 2019-07-07 DIAGNOSIS — R1032 Left lower quadrant pain: Secondary | ICD-10-CM | POA: Diagnosis not present

## 2019-07-07 DIAGNOSIS — M545 Low back pain: Secondary | ICD-10-CM | POA: Diagnosis not present

## 2019-07-13 DIAGNOSIS — Z23 Encounter for immunization: Secondary | ICD-10-CM | POA: Diagnosis not present

## 2019-07-20 ENCOUNTER — Encounter (INDEPENDENT_AMBULATORY_CARE_PROVIDER_SITE_OTHER): Payer: Self-pay

## 2019-08-09 DIAGNOSIS — Z1231 Encounter for screening mammogram for malignant neoplasm of breast: Secondary | ICD-10-CM | POA: Diagnosis not present

## 2019-10-02 DIAGNOSIS — M159 Polyosteoarthritis, unspecified: Secondary | ICD-10-CM | POA: Diagnosis not present

## 2019-10-02 DIAGNOSIS — K5732 Diverticulitis of large intestine without perforation or abscess without bleeding: Secondary | ICD-10-CM | POA: Diagnosis not present

## 2019-10-02 DIAGNOSIS — M2550 Pain in unspecified joint: Secondary | ICD-10-CM | POA: Diagnosis not present

## 2019-10-02 DIAGNOSIS — R809 Proteinuria, unspecified: Secondary | ICD-10-CM | POA: Diagnosis not present

## 2019-10-02 DIAGNOSIS — E039 Hypothyroidism, unspecified: Secondary | ICD-10-CM | POA: Diagnosis not present

## 2019-10-02 DIAGNOSIS — I119 Hypertensive heart disease without heart failure: Secondary | ICD-10-CM | POA: Diagnosis not present

## 2019-10-02 DIAGNOSIS — N951 Menopausal and female climacteric states: Secondary | ICD-10-CM | POA: Diagnosis not present

## 2019-10-02 DIAGNOSIS — Z8249 Family history of ischemic heart disease and other diseases of the circulatory system: Secondary | ICD-10-CM | POA: Diagnosis not present

## 2019-10-02 DIAGNOSIS — E559 Vitamin D deficiency, unspecified: Secondary | ICD-10-CM | POA: Diagnosis not present

## 2019-10-02 DIAGNOSIS — G4700 Insomnia, unspecified: Secondary | ICD-10-CM | POA: Diagnosis not present

## 2019-10-02 DIAGNOSIS — Z79899 Other long term (current) drug therapy: Secondary | ICD-10-CM | POA: Diagnosis not present

## 2019-10-17 DIAGNOSIS — F312 Bipolar disorder, current episode manic severe with psychotic features: Secondary | ICD-10-CM | POA: Diagnosis not present

## 2019-11-01 DIAGNOSIS — R1032 Left lower quadrant pain: Secondary | ICD-10-CM | POA: Diagnosis not present

## 2019-11-05 DIAGNOSIS — M545 Low back pain: Secondary | ICD-10-CM | POA: Diagnosis not present

## 2019-11-05 DIAGNOSIS — H40023 Open angle with borderline findings, high risk, bilateral: Secondary | ICD-10-CM | POA: Diagnosis not present

## 2019-11-05 DIAGNOSIS — H02403 Unspecified ptosis of bilateral eyelids: Secondary | ICD-10-CM | POA: Diagnosis not present

## 2019-11-05 DIAGNOSIS — H35373 Puckering of macula, bilateral: Secondary | ICD-10-CM | POA: Diagnosis not present

## 2019-11-05 DIAGNOSIS — Z961 Presence of intraocular lens: Secondary | ICD-10-CM | POA: Diagnosis not present

## 2019-11-17 ENCOUNTER — Ambulatory Visit: Payer: Medicare Other | Attending: Internal Medicine

## 2019-11-17 DIAGNOSIS — Z23 Encounter for immunization: Secondary | ICD-10-CM

## 2019-11-17 NOTE — Progress Notes (Signed)
   Covid-19 Vaccination Clinic  Name:  Emily Murray    MRN: [id number] DOB: [birth year]  11/17/2019  Emily Murray was observed post Covid-19 immunization for 15 minutes without incidence. She was provided with Vaccine Information Sheet and instruction to access the V-Safe system.   Emily Murray was instructed to call 911 with any severe reactions post vaccine: Marland Kitchen Difficulty breathing  . Swelling of your face and throat  . A fast heartbeat  . A bad rash all over your body  . Dizziness and weakness    Immunizations Administered    Name Date Dose VIS Date Route   Pfizer COVID-19 Vaccine 11/17/2019 12:01 PM 0.3 mL 09/07/2019 Intramuscular   Manufacturer: [place]   Lot: [id number]   [place]: [id number]

## 2019-11-29 DIAGNOSIS — L988 Other specified disorders of the skin and subcutaneous tissue: Secondary | ICD-10-CM | POA: Diagnosis not present

## 2019-11-29 DIAGNOSIS — L821 Other seborrheic keratosis: Secondary | ICD-10-CM | POA: Diagnosis not present

## 2019-12-11 ENCOUNTER — Ambulatory Visit: Payer: Medicare Other | Attending: Internal Medicine

## 2019-12-11 DIAGNOSIS — Z23 Encounter for immunization: Secondary | ICD-10-CM

## 2019-12-11 NOTE — Progress Notes (Signed)
   Covid-19 Vaccination Clinic  Name:  Emily Murray    MRN: [id number] DOB: 1951-01-20  12/11/2019  Emily Murray was observed post Covid-19 immunization for 15 minutes without incident. She was provided with Vaccine Information Sheet and instruction to access the V-Safe system.   Emily Murray was instructed to call 911 with any severe reactions post vaccine: Marland Kitchen Difficulty breathing  . Swelling of face and throat  . A fast heartbeat  . A bad rash all over body  . Dizziness and weakness   Immunizations Administered    Name Date Dose VIS Date Route   Pfizer COVID-19 Vaccine 12/11/2019 12:16 PM 0.3 mL 09/07/2019 Intramuscular   Manufacturer: [place]   Lot: [id number]   [place]: [id number]

## 2019-12-13 DIAGNOSIS — N951 Menopausal and female climacteric states: Secondary | ICD-10-CM | POA: Diagnosis not present

## 2019-12-13 DIAGNOSIS — R635 Abnormal weight gain: Secondary | ICD-10-CM | POA: Diagnosis not present

## 2019-12-13 DIAGNOSIS — R5383 Other fatigue: Secondary | ICD-10-CM | POA: Diagnosis not present

## 2019-12-13 DIAGNOSIS — E039 Hypothyroidism, unspecified: Secondary | ICD-10-CM | POA: Diagnosis not present

## 2019-12-18 DIAGNOSIS — Z1339 Encounter for screening examination for other mental health and behavioral disorders: Secondary | ICD-10-CM | POA: Diagnosis not present

## 2019-12-18 DIAGNOSIS — Z1331 Encounter for screening for depression: Secondary | ICD-10-CM | POA: Diagnosis not present

## 2019-12-18 DIAGNOSIS — Z6829 Body mass index (BMI) 29.0-29.9, adult: Secondary | ICD-10-CM | POA: Diagnosis not present

## 2019-12-18 DIAGNOSIS — N898 Other specified noninflammatory disorders of vagina: Secondary | ICD-10-CM | POA: Diagnosis not present

## 2019-12-18 DIAGNOSIS — E7800 Pure hypercholesterolemia, unspecified: Secondary | ICD-10-CM | POA: Diagnosis not present

## 2019-12-18 DIAGNOSIS — N951 Menopausal and female climacteric states: Secondary | ICD-10-CM | POA: Diagnosis not present

## 2019-12-18 DIAGNOSIS — R6882 Decreased libido: Secondary | ICD-10-CM | POA: Diagnosis not present

## 2019-12-26 DIAGNOSIS — Z6829 Body mass index (BMI) 29.0-29.9, adult: Secondary | ICD-10-CM | POA: Diagnosis not present

## 2019-12-26 DIAGNOSIS — E7800 Pure hypercholesterolemia, unspecified: Secondary | ICD-10-CM | POA: Diagnosis not present

## 2020-01-03 DIAGNOSIS — Z6829 Body mass index (BMI) 29.0-29.9, adult: Secondary | ICD-10-CM | POA: Diagnosis not present

## 2020-01-03 DIAGNOSIS — E7800 Pure hypercholesterolemia, unspecified: Secondary | ICD-10-CM | POA: Diagnosis not present

## 2020-01-09 DIAGNOSIS — Z6828 Body mass index (BMI) 28.0-28.9, adult: Secondary | ICD-10-CM | POA: Diagnosis not present

## 2020-01-09 DIAGNOSIS — E7800 Pure hypercholesterolemia, unspecified: Secondary | ICD-10-CM | POA: Diagnosis not present

## 2020-01-15 DIAGNOSIS — M25561 Pain in right knee: Secondary | ICD-10-CM | POA: Diagnosis not present

## 2020-01-17 DIAGNOSIS — Z780 Asymptomatic menopausal state: Secondary | ICD-10-CM | POA: Diagnosis not present

## 2020-01-17 DIAGNOSIS — Z79891 Long term (current) use of opiate analgesic: Secondary | ICD-10-CM | POA: Diagnosis not present

## 2020-01-17 DIAGNOSIS — I119 Hypertensive heart disease without heart failure: Secondary | ICD-10-CM | POA: Diagnosis not present

## 2020-01-17 DIAGNOSIS — M2550 Pain in unspecified joint: Secondary | ICD-10-CM | POA: Diagnosis not present

## 2020-01-17 DIAGNOSIS — M159 Polyosteoarthritis, unspecified: Secondary | ICD-10-CM | POA: Diagnosis not present

## 2020-01-17 DIAGNOSIS — R809 Proteinuria, unspecified: Secondary | ICD-10-CM | POA: Diagnosis not present

## 2020-01-17 DIAGNOSIS — H05223 Edema of bilateral orbit: Secondary | ICD-10-CM | POA: Diagnosis not present

## 2020-01-17 DIAGNOSIS — Z8249 Family history of ischemic heart disease and other diseases of the circulatory system: Secondary | ICD-10-CM | POA: Diagnosis not present

## 2020-01-17 DIAGNOSIS — E039 Hypothyroidism, unspecified: Secondary | ICD-10-CM | POA: Diagnosis not present

## 2020-01-17 DIAGNOSIS — G4700 Insomnia, unspecified: Secondary | ICD-10-CM | POA: Diagnosis not present

## 2020-01-17 DIAGNOSIS — E559 Vitamin D deficiency, unspecified: Secondary | ICD-10-CM | POA: Diagnosis not present

## 2020-01-17 DIAGNOSIS — Z79899 Other long term (current) drug therapy: Secondary | ICD-10-CM | POA: Diagnosis not present

## 2020-01-22 DIAGNOSIS — E7800 Pure hypercholesterolemia, unspecified: Secondary | ICD-10-CM | POA: Diagnosis not present

## 2020-01-22 DIAGNOSIS — Z6828 Body mass index (BMI) 28.0-28.9, adult: Secondary | ICD-10-CM | POA: Diagnosis not present

## 2020-02-06 DIAGNOSIS — E7800 Pure hypercholesterolemia, unspecified: Secondary | ICD-10-CM | POA: Diagnosis not present

## 2020-02-06 DIAGNOSIS — Z6827 Body mass index (BMI) 27.0-27.9, adult: Secondary | ICD-10-CM | POA: Diagnosis not present

## 2020-02-20 DIAGNOSIS — M25561 Pain in right knee: Secondary | ICD-10-CM | POA: Diagnosis not present

## 2020-02-21 DIAGNOSIS — E7800 Pure hypercholesterolemia, unspecified: Secondary | ICD-10-CM | POA: Diagnosis not present

## 2020-02-21 DIAGNOSIS — Z6827 Body mass index (BMI) 27.0-27.9, adult: Secondary | ICD-10-CM | POA: Diagnosis not present

## 2020-04-10 DIAGNOSIS — E039 Hypothyroidism, unspecified: Secondary | ICD-10-CM | POA: Diagnosis not present

## 2020-04-10 DIAGNOSIS — Z6826 Body mass index (BMI) 26.0-26.9, adult: Secondary | ICD-10-CM | POA: Diagnosis not present

## 2020-04-17 DIAGNOSIS — E039 Hypothyroidism, unspecified: Secondary | ICD-10-CM | POA: Diagnosis not present

## 2020-04-17 DIAGNOSIS — E7800 Pure hypercholesterolemia, unspecified: Secondary | ICD-10-CM | POA: Diagnosis not present

## 2020-04-17 DIAGNOSIS — Z6826 Body mass index (BMI) 26.0-26.9, adult: Secondary | ICD-10-CM | POA: Diagnosis not present

## 2020-04-24 DIAGNOSIS — H6123 Impacted cerumen, bilateral: Secondary | ICD-10-CM | POA: Diagnosis not present

## 2020-04-24 DIAGNOSIS — M25561 Pain in right knee: Secondary | ICD-10-CM | POA: Diagnosis not present

## 2020-05-01 DIAGNOSIS — E7800 Pure hypercholesterolemia, unspecified: Secondary | ICD-10-CM | POA: Diagnosis not present

## 2020-05-01 DIAGNOSIS — Z6826 Body mass index (BMI) 26.0-26.9, adult: Secondary | ICD-10-CM | POA: Diagnosis not present

## 2020-05-15 DIAGNOSIS — Z6826 Body mass index (BMI) 26.0-26.9, adult: Secondary | ICD-10-CM | POA: Diagnosis not present

## 2020-05-15 DIAGNOSIS — E7800 Pure hypercholesterolemia, unspecified: Secondary | ICD-10-CM | POA: Diagnosis not present

## 2020-05-19 DIAGNOSIS — M2550 Pain in unspecified joint: Secondary | ICD-10-CM | POA: Diagnosis not present

## 2020-05-19 DIAGNOSIS — Z8249 Family history of ischemic heart disease and other diseases of the circulatory system: Secondary | ICD-10-CM | POA: Diagnosis not present

## 2020-05-19 DIAGNOSIS — E559 Vitamin D deficiency, unspecified: Secondary | ICD-10-CM | POA: Diagnosis not present

## 2020-05-19 DIAGNOSIS — M159 Polyosteoarthritis, unspecified: Secondary | ICD-10-CM | POA: Diagnosis not present

## 2020-05-19 DIAGNOSIS — Z780 Asymptomatic menopausal state: Secondary | ICD-10-CM | POA: Diagnosis not present

## 2020-05-19 DIAGNOSIS — M5431 Sciatica, right side: Secondary | ICD-10-CM | POA: Diagnosis not present

## 2020-05-19 DIAGNOSIS — R809 Proteinuria, unspecified: Secondary | ICD-10-CM | POA: Diagnosis not present

## 2020-05-19 DIAGNOSIS — E039 Hypothyroidism, unspecified: Secondary | ICD-10-CM | POA: Diagnosis not present

## 2020-05-19 DIAGNOSIS — Z0001 Encounter for general adult medical examination with abnormal findings: Secondary | ICD-10-CM | POA: Diagnosis not present

## 2020-05-19 DIAGNOSIS — I119 Hypertensive heart disease without heart failure: Secondary | ICD-10-CM | POA: Diagnosis not present

## 2020-05-19 DIAGNOSIS — Z79899 Other long term (current) drug therapy: Secondary | ICD-10-CM | POA: Diagnosis not present

## 2020-05-19 DIAGNOSIS — G4700 Insomnia, unspecified: Secondary | ICD-10-CM | POA: Diagnosis not present

## 2020-06-04 DIAGNOSIS — R5383 Other fatigue: Secondary | ICD-10-CM | POA: Diagnosis not present

## 2020-06-04 DIAGNOSIS — N951 Menopausal and female climacteric states: Secondary | ICD-10-CM | POA: Diagnosis not present

## 2020-06-04 DIAGNOSIS — E039 Hypothyroidism, unspecified: Secondary | ICD-10-CM | POA: Diagnosis not present

## 2020-06-10 DIAGNOSIS — M25561 Pain in right knee: Secondary | ICD-10-CM | POA: Diagnosis not present

## 2020-06-12 DIAGNOSIS — E039 Hypothyroidism, unspecified: Secondary | ICD-10-CM | POA: Diagnosis not present

## 2020-06-12 DIAGNOSIS — Z6825 Body mass index (BMI) 25.0-25.9, adult: Secondary | ICD-10-CM | POA: Diagnosis not present

## 2020-06-26 DIAGNOSIS — E039 Hypothyroidism, unspecified: Secondary | ICD-10-CM | POA: Diagnosis not present

## 2020-06-26 DIAGNOSIS — E7800 Pure hypercholesterolemia, unspecified: Secondary | ICD-10-CM | POA: Diagnosis not present

## 2020-06-26 DIAGNOSIS — Z6825 Body mass index (BMI) 25.0-25.9, adult: Secondary | ICD-10-CM | POA: Diagnosis not present

## 2020-07-16 DIAGNOSIS — Z6825 Body mass index (BMI) 25.0-25.9, adult: Secondary | ICD-10-CM | POA: Diagnosis not present

## 2020-07-16 DIAGNOSIS — E7800 Pure hypercholesterolemia, unspecified: Secondary | ICD-10-CM | POA: Diagnosis not present

## 2020-07-28 DIAGNOSIS — Z1159 Encounter for screening for other viral diseases: Secondary | ICD-10-CM | POA: Diagnosis not present

## 2020-07-30 DIAGNOSIS — E7800 Pure hypercholesterolemia, unspecified: Secondary | ICD-10-CM | POA: Diagnosis not present

## 2020-07-30 DIAGNOSIS — Z6826 Body mass index (BMI) 26.0-26.9, adult: Secondary | ICD-10-CM | POA: Diagnosis not present

## 2020-07-31 DIAGNOSIS — Z1211 Encounter for screening for malignant neoplasm of colon: Secondary | ICD-10-CM | POA: Diagnosis not present

## 2020-07-31 DIAGNOSIS — K5730 Diverticulosis of large intestine without perforation or abscess without bleeding: Secondary | ICD-10-CM | POA: Diagnosis not present

## 2020-07-31 DIAGNOSIS — D175 Benign lipomatous neoplasm of intra-abdominal organs: Secondary | ICD-10-CM | POA: Diagnosis not present

## 2020-07-31 DIAGNOSIS — D122 Benign neoplasm of ascending colon: Secondary | ICD-10-CM | POA: Diagnosis not present

## 2020-07-31 DIAGNOSIS — D120 Benign neoplasm of cecum: Secondary | ICD-10-CM | POA: Diagnosis not present

## 2020-08-05 DIAGNOSIS — D122 Benign neoplasm of ascending colon: Secondary | ICD-10-CM | POA: Diagnosis not present

## 2020-08-05 DIAGNOSIS — D120 Benign neoplasm of cecum: Secondary | ICD-10-CM | POA: Diagnosis not present

## 2020-08-08 DIAGNOSIS — M25561 Pain in right knee: Secondary | ICD-10-CM | POA: Diagnosis not present

## 2020-08-15 DIAGNOSIS — Z20822 Contact with and (suspected) exposure to covid-19: Secondary | ICD-10-CM | POA: Diagnosis not present

## 2020-08-27 DIAGNOSIS — M791 Myalgia, unspecified site: Secondary | ICD-10-CM | POA: Diagnosis not present

## 2020-08-27 DIAGNOSIS — R8271 Bacteriuria: Secondary | ICD-10-CM | POA: Diagnosis not present

## 2020-09-08 DIAGNOSIS — E039 Hypothyroidism, unspecified: Secondary | ICD-10-CM | POA: Diagnosis not present

## 2020-09-08 DIAGNOSIS — N951 Menopausal and female climacteric states: Secondary | ICD-10-CM | POA: Diagnosis not present

## 2020-09-08 DIAGNOSIS — E7800 Pure hypercholesterolemia, unspecified: Secondary | ICD-10-CM | POA: Diagnosis not present

## 2020-09-09 DIAGNOSIS — Z8249 Family history of ischemic heart disease and other diseases of the circulatory system: Secondary | ICD-10-CM | POA: Diagnosis not present

## 2020-09-09 DIAGNOSIS — Z79891 Long term (current) use of opiate analgesic: Secondary | ICD-10-CM | POA: Diagnosis not present

## 2020-09-09 DIAGNOSIS — E559 Vitamin D deficiency, unspecified: Secondary | ICD-10-CM | POA: Diagnosis not present

## 2020-09-09 DIAGNOSIS — Z79899 Other long term (current) drug therapy: Secondary | ICD-10-CM | POA: Diagnosis not present

## 2020-09-09 DIAGNOSIS — M791 Myalgia, unspecified site: Secondary | ICD-10-CM | POA: Diagnosis not present

## 2020-09-09 DIAGNOSIS — M2550 Pain in unspecified joint: Secondary | ICD-10-CM | POA: Diagnosis not present

## 2020-09-09 DIAGNOSIS — I119 Hypertensive heart disease without heart failure: Secondary | ICD-10-CM | POA: Diagnosis not present

## 2020-09-11 ENCOUNTER — Encounter: Payer: Self-pay | Admitting: Cardiology

## 2020-09-11 ENCOUNTER — Ambulatory Visit: Payer: Medicare Other | Admitting: Cardiology

## 2020-09-11 ENCOUNTER — Other Ambulatory Visit: Payer: Self-pay

## 2020-09-11 VITALS — BP 144/77 | HR 77 | Ht 63.0 in | Wt 145.0 lb

## 2020-09-11 DIAGNOSIS — I2510 Atherosclerotic heart disease of native coronary artery without angina pectoris: Secondary | ICD-10-CM | POA: Diagnosis not present

## 2020-09-11 DIAGNOSIS — E039 Hypothyroidism, unspecified: Secondary | ICD-10-CM | POA: Diagnosis not present

## 2020-09-11 DIAGNOSIS — I10 Essential (primary) hypertension: Secondary | ICD-10-CM | POA: Diagnosis not present

## 2020-09-11 DIAGNOSIS — I77810 Thoracic aortic ectasia: Secondary | ICD-10-CM | POA: Diagnosis not present

## 2020-09-11 DIAGNOSIS — F32A Depression, unspecified: Secondary | ICD-10-CM | POA: Diagnosis not present

## 2020-09-11 MED ORDER — ASPIRIN 81 MG PO TBEC: 81.0000 mg | DELAYED_RELEASE_TABLET | Freq: Every day | ORAL | 2 refills | Status: AC

## 2020-09-11 MED ORDER — SPIRONOLACTONE 25 MG PO TABS: 25.0000 mg | ORAL_TABLET | Freq: Every morning | ORAL | 0 refills | Status: DC

## 2020-09-11 NOTE — Progress Notes (Signed)
Date:  09/11/2020   ID:  Emily Murray, Emily Murray September 12, 1951, MRN [id number]  PCP:  Vincente Liberty, MD  Cardiologist:  Rex Kras, DO, [facility] (established care 09/11/2020) Former Cardiology Providers: Dixie Dials   REASON FOR CONSULT: Ectatic thoracic aorta  REQUESTING PHYSICIAN:  Vincente Liberty, MD [address] [place] [employer],  [place] [address]  Chief Complaint  Patient presents with  . New Patient (Initial Visit)    Dilated aorta    HPI  Emily Murray is a 70 y.o. female who presents to the office with a chief complaint of " dilated aorta." Patient's past medical history and cardiovascular risk factors include: Ectatic thoracic aorta, nonobstructive CAD, hypertension, hypothyroidism, depression, postmenopausal female, advanced age.  She is referred to the office at the request of Vincente Liberty, MD for evaluation of ectatic thoracic aorta.  Patient is accompanied by her husband at today's office visit  Patient recently went to [facility] urgent care for upper respiratory tract infection and had a chest x-ray which noted moderate degree of ectasia of the thoracic aorta.  He was referred to the office for further evaluation and management.  In addition, patient has been having back pain mostly located over the left shoulder blade.  She denies any discomfort between the 2 shoulder blades.  Blood pressure not well controlled and is currently on Hyzaar.  Patient states that she does not check her blood pressures at home.  They are elevated at today's office visit and were also elevated at her PCPs visit yesterday.  No family history of aortic dissections or cerebral aneurysms. No episodes of unintentional falls or syncope, no difficulty with gait or focal neurological deficits, no decreased urine output, and no sharp chest pain.  No history of tobacco use.  Patient did have a left heart catheterization with Dr. Doylene Canard in 2018 and was found to have  nonobstructive CAD.  Patient used to be on aspirin but discontinued it after watching Dr.Oz's TV show. No recent lipid profile to review during this encounter.   FUNCTIONAL STATUS: Stationary bike 3-5x per week for 20 minutes, gardening, and able to clean "a big house."   ALLERGIES: Allergies  Allergen Reactions  . Azo Cranberry [Cranberry Extract]     Caused numbness in lip  . Cranberry Other (See Comments)    Patient took Cranberry tablets and her lips "went numb"    MEDICATION LIST PRIOR TO VISIT: Current Meds  Medication Sig  . clonazePAM (KLONOPIN) 0.5 MG tablet Take 0.5 mg by mouth daily as needed for anxiety. For aneixty  . estrogens, conjugated, (PREMARIN) 0.625 MG tablet Take 5 mg by mouth daily. Take daily for 21 days then do not take for 7 days.  . eszopiclone (LUNESTA) 2 MG TABS tablet Take 2 mg by mouth at bedtime as needed.  . folic acid (FOLVITE) 1 MG tablet Take 1 mg by mouth daily.  Marland Kitchen HYDROcodone-acetaminophen (NORCO/VICODIN) 5-325 MG tablet Take 0.5-1 tablets by mouth every morning.   . lamoTRIgine (LAMICTAL) 100 MG tablet Take 100 mg by mouth every morning.  Marland Kitchen levothyroxine (SYNTHROID, LEVOTHROID) 150 MCG tablet Take 0.75 mcg by mouth daily before breakfast.   . lurasidone (LATUDA) 20 MG TABS tablet Take 20 mg by mouth daily.  Marland Kitchen neomycin-polymyxin-hydrocortisone (CORTISPORIN) OTIC solution Apply 1-2 drops to toe after soaking BID  . olmesartan-hydrochlorothiazide (BENICAR HCT) 40-12.5 MG per tablet Take 1 tablet by mouth daily.  . sertraline (ZOLOFT) 100 MG tablet Take 200 mg by mouth daily after breakfast.  .  Vitamin D, Ergocalciferol, (DRISDOL) 50000 UNITS CAPS capsule Take 50,000 Units by mouth every 7 (seven) days.     PAST MEDICAL HISTORY: Past Medical History:  Diagnosis Date  . Anxiety   . Bipolar 1 disorder ([place])   . Depressed   . Hypertension   . Nonobstructive atherosclerosis of coronary artery     PAST SURGICAL HISTORY: Past Surgical History:   Procedure Laterality Date  . ABDOMINAL HYSTERECTOMY    . LEFT HEART CATH AND CORONARY ANGIOGRAPHY N/A 05/05/2017   Procedure: LEFT HEART CATH AND CORONARY ANGIOGRAPHY;  Surgeon: Dixie Dials, MD;  Location: [facility];  Service: Cardiovascular;  Laterality: N/A;  . rotator cuff repair Right 2015    FAMILY HISTORY: The patient family history includes Arrhythmia in her son; Diabetes in her son; Heart disease in her mother; Hypertension in her mother; Prostate cancer in her father.  SOCIAL HISTORY:  The patient  reports that she has never smoked. She has never used smokeless tobacco. She reports that she does not drink alcohol and does not use drugs.  Review of Systems  Constitutional: Negative for chills and fever.  HENT: Negative for hoarse voice and nosebleeds.   Eyes: Negative for discharge, double vision and pain.  Cardiovascular: Negative for chest pain, claudication, dyspnea on exertion, leg swelling, near-syncope, orthopnea, palpitations, paroxysmal nocturnal dyspnea and syncope.  Respiratory: Negative for hemoptysis and shortness of breath.   Musculoskeletal: Positive for back pain. Negative for muscle cramps and myalgias.  Gastrointestinal: Negative for abdominal pain, constipation, diarrhea, hematemesis, hematochezia, melena, nausea and vomiting.  Neurological: Negative for dizziness and light-headedness.    PHYSICAL EXAM: Vitals with BMI 09/11/2020 08/18/2018 05/05/2017  Height _0  - -  Weight 145 lbs - -  BMI 40.37 - -  Systolic 543 606 770  Diastolic 77 68 340  Pulse 77 63 66    CONSTITUTIONAL: Well-developed and well-nourished. No acute distress.  SKIN: Skin is warm and dry. No rash noted. No cyanosis. No pallor. No jaundice HEAD: Normocephalic and atraumatic.  EYES: No scleral icterus MOUTH/THROAT: Moist oral membranes.  NECK: No JVD present. No thyromegaly noted. No carotid bruits  LYMPHATIC: No visible cervical adenopathy.  CHEST Normal respiratory  effort. No intercostal retractions  LUNGS: Clear to auscultation bilaterally. No stridor. No wheezes. No rales.  CARDIOVASCULAR: Regular rate and rhythm, positive S1-S2, no murmurs rubs or gallops appreciated. ABDOMINAL: No apparent ascites.  EXTREMITIES: No peripheral edema  HEMATOLOGIC: No significant bruising NEUROLOGIC: Oriented to person, place, and time. Nonfocal. Normal muscle tone.  PSYCHIATRIC: Normal mood and affect. Normal behavior. Cooperative  RADIOLOGY: Chest x-ray [facility] at urgent care 08/25/2020: Heart normal size. Moderate to marked ectasia of the thoracic aorta.  Thoracolumbar levoscoliosis.  No active disease  CARDIAC DATABASE: EKG: 09/11/2020: Normal sinus rhythm, 70 bpm, normal axis, poor R wave progression, without underlying injury pattern.  Echocardiogram: 05/03/2017: 60-65%, no regional wall motion abnormalities, G1DD, Mild MR, Moderate TR, dilated PA, PASP 49mHg.   Stress Testing: More than 8-9 years ago at outside facility.   Heart Catheterization: 05/05/2017:   Prox LAD to Mid LAD lesion, 20 %stenosed.  Ost Cx to Prox Cx lesion, 15 %stenosed.  Prox RCA to Mid RCA lesion, 20 %stenosed.   Medical treatment with life-style modification.  LABORATORY DATA: CBC Latest Ref Rng & Units 05/05/2017 05/03/2017 05/02/2017  WBC 4.0 - 10.5 K/uL 5.6 8.6 -  Hemoglobin 12.0 - 15.0 g/dL 12.6 12.2 14.3  Hematocrit 36.0 - 46.0 % 37.9 36.5  42.0  Platelets 150 - 400 K/uL 263 229 -    CMP Latest Ref Rng & Units 05/05/2017 05/03/2017 05/02/2017  Glucose 65 - 99 mg/dL 81 115(H) 116(H)  BUN 6 - 20 mg/dL _0 Creatinine 0.44 - 1.00 mg/dL 0.72 0.75 0.80  Sodium 135 - 145 mmol/L 139 139 139  Potassium 3.5 - 5.1 mmol/L 3.9 3.5 3.8  Chloride 101 - 111 mmol/L 108 107 104  CO2 22 - 32 mmol/L 24 25 -  Calcium 8.9 - 10.3 mg/dL 8.7(L) 8.7(L) -  Total Protein 6.5 - 8.1 g/dL - 5.5(L) -  Total Bilirubin 0.3 - 1.2 mg/dL - 0.3 -  Alkaline Phos 38 - 126 U/L - 91 -  AST 15  - 41 U/L - 33 -  ALT 14 - 54 U/L - 26 -    Lipid Panel     Component Value Date/Time   CHOL  08/04/2007 0450    132        ATP III CLASSIFICATION:  <200     mg/dL   Desirable  200-239  mg/dL   Borderline High  >=240    mg/dL   High   TRIG 32 08/04/2007 0450   HDL 58 08/04/2007 0450   CHOLHDL 2.3 08/04/2007 0450   VLDL 6 08/04/2007 0450   LDLCALC  08/04/2007 0450    68        Total Cholesterol/HDL:CHD Risk Coronary Heart Disease Risk Table                     Men   Women  1/2 Average Risk   3.4   3.3    No components found for: NTPROBNP No results for input(s): PROBNP in the last 8760 hours. No results for input(s): TSH in the last 8760 hours.  BMP No results for input(s): NA, K, CL, CO2, GLUCOSE, BUN, CREATININE, CALCIUM, GFRNONAA, GFRAA in the last 8760 hours.  HEMOGLOBIN A1C No results found for: HGBA1C, MPG   External Labs: Collected: 08/25/2020 [facility] urgent care Hemoglobin 14.4 g/dL, hematocrit 43% Creatinine 0.91 mg/dL. eGFR: 74 mL/min per 1.73 m AST 19, ALT 12, alkaline phosphatase 80  IMPRESSION:    ICD-10-CM   1. Ectatic thoracic aorta (HCC)  I77.810 EKG 12-Lead    CTA CHEST/ABD/PEL DISSECTION W&/OR WO & GATING    Lipid Panel With LDL/HDL Ratio    LDL cholesterol, direct  2. Nonobstructive atherosclerosis of coronary artery  I25.10 PCV ECHOCARDIOGRAM COMPLETE    Lipid Panel With LDL/HDL Ratio    LDL cholesterol, direct    aspirin 81 MG EC tablet  3. Benign hypertension  I10 spironolactone (ALDACTONE) 25 MG tablet    PCV ECHOCARDIOGRAM COMPLETE    Basic metabolic panel    Magnesium  4. Hypothyroidism, unspecified type  E03.9   5. Depression, unspecified depression type  F32.A      RECOMMENDATIONS: TAJI SATHER is a 69 y.o. female whose past medical history and cardiac risk factors include: Ectatic thoracic aorta, nonobstructive CAD, hypertension, hypothyroidism, depression, postmenopausal female, advanced age.  Ectatic thoracic  aorta:  Clinically very low suspicion for acute aortic syndromes.  Chest x-ray performed at outside facility noted moderate to marked ectasia of the thoracic aorta.   Left arm blood pressure 150/70mHg.  Right arm blood pressure 152/825mg.  No family history of aortic dissection or cerebral aneurysm  No history of smoking.  Prior echocardiogram did not report aortic root dilatation.  EKG performed at today's office  visit independently reviewed and noted above.   Recommend baseline CTA Chest/Abd/Pelvis to evaluate the Aorta given uncontrolled BP, abnormal CXR, and back pain.  Further recommendations to follow.  Echocardiogram will be ordered to evaluate for structural heart disease and left ventricular systolic function.  Patient also has history of nonobstructive CAD recommend ASA 74m po qday and will recheck lipid profile.   Recommend better BP management w/ goal SBP between 120-1371mg if able to tolerated.   Start Aldactone 2572mo qAM.   Labs in one week to recheck BMP and Mg level.   Hypertension, essential:   Recommend better BP management w/ goal SBP between 120-130m65mif able to tolerated.   Start Aldactone 25mg23mqAM.   Labs in one week to recheck BMP and Mg level.   Low salt diet recommended.   Patient is encouraged to keep a BP log and to bring it with her at the next office visit.   Nonobstructive CAD: Recommend initiation of aspirin 81 mg p.o. daily.  Recommended statin therapy as well.  However, patient states that her cholesterol is very well controlled and does not want to be on statin medications as of yet. Will recheck lipids.   Hypothyroidism: Currently managed by primary care provider.  FINAL MEDICATION LIST END OF ENCOUNTER: Meds ordered this encounter  Medications  . spironolactone (ALDACTONE) 25 MG tablet    Sig: Take 1 tablet (25 mg total) by mouth in the morning.    Dispense:  90 tablet    Refill:  0  . aspirin 81 MG EC tablet    Sig:  Take 1 tablet (81 mg total) by mouth daily.    Dispense:  30 tablet    Refill:  2    Medications Discontinued During This Encounter  Medication Reason  . cyclobenzaprine (FLEXERIL) 10 MG tablet Completed Course  . aspirin EC 81 MG EC tablet Reorder     Current Outpatient Medications:  .  clonazePAM (KLONOPIN) 0.5 MG tablet, Take 0.5 mg by mouth daily as needed for anxiety. For aneixty, Disp: , Rfl:  .  estrogens, conjugated, (PREMARIN) 0.625 MG tablet, Take 5 mg by mouth daily. Take daily for 21 days then do not take for 7 days., Disp: , Rfl:  .  eszopiclone (LUNESTA) 2 MG TABS tablet, Take 2 mg by mouth at bedtime as needed., Disp: , Rfl:  .  folic acid (FOLVITE) 1 MG tablet, Take 1 mg by mouth daily., Disp: , Rfl:  .  HYDROcodone-acetaminophen (NORCO/VICODIN) 5-325 MG tablet, Take 0.5-1 tablets by mouth every morning. , Disp: , Rfl:  .  lamoTRIgine (LAMICTAL) 100 MG tablet, Take 100 mg by mouth every morning., Disp: , Rfl:  .  levothyroxine (SYNTHROID, LEVOTHROID) 150 MCG tablet, Take 0.75 mcg by mouth daily before breakfast. , Disp: , Rfl:  .  lurasidone (LATUDA) 20 MG TABS tablet, Take 20 mg by mouth daily., Disp: , Rfl:  .  neomycin-polymyxin-hydrocortisone (CORTISPORIN) OTIC solution, Apply 1-2 drops to toe after soaking BID, Disp: 10 mL, Rfl: 1 .  olmesartan-hydrochlorothiazide (BENICAR HCT) 40-12.5 MG per tablet, Take 1 tablet by mouth daily., Disp: , Rfl:  .  sertraline (ZOLOFT) 100 MG tablet, Take 200 mg by mouth daily after breakfast., Disp: , Rfl:  .  Vitamin D, Ergocalciferol, (DRISDOL) 50000 UNITS CAPS capsule, Take 50,000 Units by mouth every 7 (seven) days., Disp: , Rfl:  .  aspirin 81 MG EC tablet, Take 1 tablet (81 mg total) by mouth daily., Disp: 30  tablet, Rfl: 2 .  spironolactone (ALDACTONE) 25 MG tablet, Take 1 tablet (25 mg total) by mouth in the morning., Disp: 90 tablet, Rfl: 0  Orders Placed This Encounter  Procedures  . CTA CHEST/ABD/PEL DISSECTION W&/OR WO &  GATING  . Basic metabolic panel  . Magnesium  . Lipid Panel With LDL/HDL Ratio  . LDL cholesterol, direct  . EKG 12-Lead  . PCV ECHOCARDIOGRAM COMPLETE    There are no Patient Instructions on file for this visit.   --Continue cardiac medications as reconciled in final medication list. --Return in about 4 weeks (around 10/09/2020) for Follow up, Review test results, BP. Or sooner if needed. --Continue follow-up with your primary care physician regarding the management of your other chronic comorbid conditions.  Patient's questions and concerns were addressed to her satisfaction. She voices understanding of the instructions provided during this encounter.   This note was created using a voice recognition software as a result there may be grammatical errors inadvertently enclosed that do not reflect the nature of this encounter. Every attempt is made to correct such errors.  Rex Kras, [place], [facility]  Pager: [phone] Office: [phone]

## 2020-09-23 ENCOUNTER — Ambulatory Visit: Payer: Medicare Other

## 2020-09-23 DIAGNOSIS — I2510 Atherosclerotic heart disease of native coronary artery without angina pectoris: Secondary | ICD-10-CM | POA: Diagnosis not present

## 2020-09-23 DIAGNOSIS — I10 Essential (primary) hypertension: Secondary | ICD-10-CM | POA: Diagnosis not present

## 2020-09-24 DIAGNOSIS — I2510 Atherosclerotic heart disease of native coronary artery without angina pectoris: Secondary | ICD-10-CM | POA: Diagnosis not present

## 2020-09-24 DIAGNOSIS — I10 Essential (primary) hypertension: Secondary | ICD-10-CM | POA: Diagnosis not present

## 2020-09-24 DIAGNOSIS — I77810 Thoracic aortic ectasia: Secondary | ICD-10-CM | POA: Diagnosis not present

## 2020-09-25 LAB — BASIC METABOLIC PANEL
BUN/Creatinine Ratio: 15 (ref 12–28)
BUN: 12 mg/dL (ref 8–27)
CO2: 20 mmol/L (ref 20–29)
Calcium: 9.5 mg/dL (ref 8.7–10.3)
Chloride: 105 mmol/L (ref 96–106)
Creatinine, Ser: 0.8 mg/dL (ref 0.57–1.00)
GFR calc Af Amer: 86 mL/min/{1.73_m2} (ref >59)
GFR calc non Af Amer: 74 mL/min/{1.73_m2} (ref >59)
Glucose: 78 mg/dL (ref 65–99)
Potassium: 4.3 mmol/L (ref 3.5–5.2)
Sodium: 137 mmol/L (ref 134–144)

## 2020-09-25 LAB — LIPID PANEL WITH LDL/HDL RATIO
Cholesterol, Total: 180 mg/dL (ref 100–199)
HDL: 79 mg/dL (ref >39)
LDL Chol Calc (NIH): 86 mg/dL (ref 0–99)
LDL/HDL Ratio: 1.1 ratio (ref 0.0–3.2)
Triglycerides: 84 mg/dL (ref 0–149)
VLDL Cholesterol Cal: 15 mg/dL (ref 5–40)

## 2020-09-25 LAB — LDL CHOLESTEROL, DIRECT: LDL Direct: 89 mg/dL (ref 0–99)

## 2020-09-25 LAB — MAGNESIUM: Magnesium: 1.8 mg/dL (ref 1.6–2.3)

## 2020-10-03 NOTE — Progress Notes (Signed)
Called and spoke with pt regarding magnesium & potassium results. Pt voiced understanding. AD

## 2020-10-07 ENCOUNTER — Ambulatory Visit (HOSPITAL_COMMUNITY): Payer: Medicare PPO

## 2020-10-14 ENCOUNTER — Ambulatory Visit (HOSPITAL_COMMUNITY)

## 2020-10-16 ENCOUNTER — Ambulatory Visit: Payer: Medicare PPO | Admitting: Cardiology

## 2020-10-17 ENCOUNTER — Ambulatory Visit (HOSPITAL_COMMUNITY): Admission: RE | Admit: 2020-10-17 | Payer: Medicare PPO | Source: Ambulatory Visit

## 2020-10-21 ENCOUNTER — Other Ambulatory Visit: Payer: Self-pay

## 2020-10-21 ENCOUNTER — Ambulatory Visit (HOSPITAL_COMMUNITY)
Admission: RE | Admit: 2020-10-21 | Discharge: 2020-10-21 | Disposition: A | Discharge status: Home / Self Care | Payer: Medicare PPO | Source: Ambulatory Visit | Attending: Cardiology | Admitting: Cardiology

## 2020-10-21 DIAGNOSIS — I77810 Thoracic aortic ectasia: Secondary | ICD-10-CM | POA: Diagnosis present

## 2020-10-21 MED ORDER — IOHEXOL 350 MG/ML SOLN
95.0000 mL | Freq: Once | INTRAVENOUS | Status: AC | PRN
  Administered 2020-10-21: 95 mL via INTRAVENOUS

## 2020-10-22 NOTE — Progress Notes (Signed)
From Dr Terri Skains

## 2020-11-04 NOTE — Progress Notes (Signed)
Attempted to get fax number from pt. No answer, left vm requesting call back.

## 2020-11-06 ENCOUNTER — Ambulatory Visit: Payer: Medicare PPO | Admitting: Cardiology

## 2020-11-06 ENCOUNTER — Other Ambulatory Visit: Payer: Self-pay

## 2020-11-06 ENCOUNTER — Encounter: Payer: Self-pay | Admitting: Cardiology

## 2020-11-06 VITALS — BP 146/82 | HR 79 | Temp 98.0°F | Resp 17 | Ht 63.0 in | Wt 147.8 lb

## 2020-11-06 DIAGNOSIS — I700 Atherosclerosis of aorta: Secondary | ICD-10-CM

## 2020-11-06 DIAGNOSIS — I2510 Atherosclerotic heart disease of native coronary artery without angina pectoris: Secondary | ICD-10-CM

## 2020-11-06 DIAGNOSIS — I10 Essential (primary) hypertension: Secondary | ICD-10-CM

## 2020-11-06 DIAGNOSIS — Z712 Person consulting for explanation of examination or test findings: Secondary | ICD-10-CM

## 2020-11-06 MED ORDER — ATORVASTATIN CALCIUM 20 MG PO TABS
20.0000 mg | ORAL_TABLET | Freq: Every evening | ORAL | 0 refills | Status: DC
Start: 2020-11-06 — End: 2021-03-03

## 2020-11-06 NOTE — Progress Notes (Signed)
Called pt and got fax number. Faxing it over now.

## 2020-11-06 NOTE — Progress Notes (Signed)
Second attempt to contact pt, no answer. Left vm requesting call back.

## 2020-11-06 NOTE — Progress Notes (Signed)
ID:  Emily Murray, Emily Murray [birth year], MRN [id number]  PCP:  Vincente Liberty, MD  Cardiologist:  Rex Kras, DO, [facility] (established care 09/11/2020) Former Cardiology Providers: Dixie Dials   Date: 11/06/20 Last Office Visit: 09/11/2020  Chief Complaint  Patient presents with  . Follow-up    4 WEEK  . Results    HPI  Emily Murray is a 70 y.o. female who presents to the office with a chief complaint of " Follow up after CT scan." Patient's past medical history and cardiovascular risk factors include:  nonobstructive CAD, aortic atherosclerosis, hypertension, hypothyroidism, depression, postmenopausal female, advanced age.  She is referred to the office at the request of Vincente Liberty, MD for evaluation of ectatic thoracic aorta.  Prior to establishing care with our practice patient was seen at [facility] urgent care for upper respiratory tract infection and had a chest x-ray which noted moderate degree of ectasia of the thoracic aorta.  He was referred to the office for further evaluation and management.  Upon evaluation, she was complaining of back pain mostly located over the left shoulder blade. Blood pressure not well controlled.  Therefore, was recommended to  Undergo CT chest/abd/pelvis to rule out aortic syndromes. She is here to go over the results.   No family history of aortic dissections or cerebral aneurysms.  No history of tobacco use.  Patient did have a left heart catheterization with Dr. Doylene Canard in 2018 and was found to have nonobstructive CAD.  Patient had discontinued aspirin after watching Dr.Oz's TV show. However, since the last office visit she did restart ASA.    FUNCTIONAL STATUS: Stationary bike 3-5x per week for 20 minutes, gardening, and able to clean "a big house."   ALLERGIES: Allergies  Allergen Reactions  . Azo Cranberry [Cranberry Extract]     Caused numbness in lip  . Cranberry Other (See Comments)    Patient took  Cranberry tablets and her lips "went numb"    MEDICATION LIST PRIOR TO VISIT: Current Meds  Medication Sig  . aspirin 81 MG EC tablet Take 1 tablet (81 mg total) by mouth daily.  Marland Kitchen atorvastatin (LIPITOR) 20 MG tablet Take 1 tablet (20 mg total) by mouth at bedtime.  . clonazePAM (KLONOPIN) 0.5 MG tablet Take 0.5 mg by mouth daily as needed for anxiety. For aneixty  . estrogens, conjugated, (PREMARIN) 0.625 MG tablet Take 5 mg by mouth daily. Take daily for 21 days then do not take for 7 days.  . eszopiclone (LUNESTA) 2 MG TABS tablet Take 2 mg by mouth at bedtime as needed.  . folic acid (FOLVITE) 1 MG tablet Take 1 mg by mouth daily.  Marland Kitchen lamoTRIgine (LAMICTAL) 100 MG tablet Take 100 mg by mouth every morning.  Marland Kitchen levothyroxine (SYNTHROID, LEVOTHROID) 150 MCG tablet Take 0.75 mcg by mouth daily before breakfast.   . lurasidone (LATUDA) 20 MG TABS tablet Take 20 mg by mouth daily.  Marland Kitchen olmesartan-hydrochlorothiazide (BENICAR HCT) 40-12.5 MG per tablet Take 1 tablet by mouth daily.  . sertraline (ZOLOFT) 100 MG tablet Take 200 mg by mouth daily after breakfast.  . spironolactone (ALDACTONE) 25 MG tablet Take 1 tablet (25 mg total) by mouth in the morning.  . Vitamin D, Ergocalciferol, (DRISDOL) 50000 UNITS CAPS capsule Take 50,000 Units by mouth every 7 (seven) days.     PAST MEDICAL HISTORY: Past Medical History:  Diagnosis Date  . Anxiety   . Bipolar 1 disorder ([place])   . Depressed   .  Hypertension   . Nonobstructive atherosclerosis of coronary artery     PAST SURGICAL HISTORY: Past Surgical History:  Procedure Laterality Date  . ABDOMINAL HYSTERECTOMY    . LEFT HEART CATH AND CORONARY ANGIOGRAPHY N/A 05/05/2017   Procedure: LEFT HEART CATH AND CORONARY ANGIOGRAPHY;  Surgeon: Dixie Dials, MD;  Location: [facility];  Service: Cardiovascular;  Laterality: N/A;  . rotator cuff repair Right 2015    FAMILY HISTORY: The patient family history includes Arrhythmia in her son;  Diabetes in her son; Heart disease in her mother; Hypertension in her mother; Prostate cancer in her father.  SOCIAL HISTORY:  The patient  reports that she quit smoking about 28 years ago. Her smoking use included cigarettes. She has a 12.50 pack-year smoking history. She has never used smokeless tobacco. She reports that she does not drink alcohol and does not use drugs.  Review of Systems  Constitutional: Negative for chills and fever.  HENT: Negative for hoarse voice and nosebleeds.   Eyes: Negative for discharge, double vision and pain.  Cardiovascular: Negative for chest pain, claudication, dyspnea on exertion, leg swelling, near-syncope, orthopnea, palpitations, paroxysmal nocturnal dyspnea and syncope.  Respiratory: Negative for hemoptysis and shortness of breath.   Musculoskeletal: Positive for back pain. Negative for muscle cramps and myalgias.  Gastrointestinal: Negative for abdominal pain, constipation, diarrhea, hematemesis, hematochezia, melena, nausea and vomiting.  Neurological: Negative for dizziness and light-headedness.    PHYSICAL EXAM: Vitals with BMI 11/06/2020 09/11/2020 08/18/2018  Height $Remov'5\' 3"'rtvcNg$  $Remove'5\' 3"'GmoQBih$  -  Weight 147 lbs 13 oz 145 lbs -  BMI 64.33 29.51 -  Systolic 884 166 063  Diastolic 82 77 68  Pulse 79 77 63    CONSTITUTIONAL: Well-developed and well-nourished. No acute distress.  SKIN: Skin is warm and dry. No rash noted. No cyanosis. No pallor. No jaundice HEAD: Normocephalic and atraumatic.  EYES: No scleral icterus MOUTH/THROAT: Moist oral membranes.  NECK: No JVD present. No thyromegaly noted. No carotid bruits  LYMPHATIC: No visible cervical adenopathy.  CHEST Normal respiratory effort. No intercostal retractions  LUNGS: Clear to auscultation bilaterally. No stridor. No wheezes. No rales.  CARDIOVASCULAR: Regular rate and rhythm, positive S1-S2, no murmurs rubs or gallops appreciated. ABDOMINAL: No apparent ascites.  EXTREMITIES: No peripheral edema   HEMATOLOGIC: No significant bruising NEUROLOGIC: Oriented to person, place, and time. Nonfocal. Normal muscle tone.  PSYCHIATRIC: Normal mood and affect. Normal behavior. Cooperative  RADIOLOGY: Chest x-ray [facility] at urgent care 08/25/2020: Heart normal size. Moderate to marked ectasia of the thoracic aorta.  Thoracolumbar levoscoliosis.  No active disease  CTA Chest/ ABD/ Pelvis 09/2020: Vascular: 1. Negative for an aortic aneurysm or dissection. Normal caliber of the thoracic and abdominal aorta. 2. Distal abdominal aorta and left common iliac artery are tortuous. 3. Aortic Atherosclerosis (ICD10-I70.0).  Nonvascular: 1. Innumerable tiny nodular densities scattered throughout both lungs. Largest nodules measure 3 mm. No comparison chest CT to evaluate the stability of this finding. Suspect these tiny nodules are infectious or inflammatory but indeterminate. Slightly prominent mediastinal soft tissue, particularly near the AP window and this could represent mild lymphadenopathy. Recommend follow-up chest CT with IV contrast (standard CT) in 3 months to evaluate the mediastinum and follow-up these tiny nodules to exclude a neoplastic process. 2. Three foci of presumed enhancement in the liver. These small enhancing lesions are indeterminate but differential diagnosis includes flash filling hemangiomas. Recommend more definitive evaluation with liver MRI, with and without contrast. 3. Cecum is located in the  left upper abdomen and findings are suggestive for a mobile cecum. No acute bowel abnormalities. 4. Large duodenal diverticulum. Main pancreatic duct is mildly dilated and the duodenum diverticulum may be contributing to this finding. 5. Degenerative changes in the lumbar spine with narrowing of the central canal at L4-L5.  CARDIAC DATABASE: EKG: 09/11/2020: Normal sinus rhythm, 70 bpm, normal axis, poor R wave progression, without underlying injury  pattern.  Echocardiogram: 09/23/2020: Normal LV systolic function with visual EF 60-65%. Left ventricle cavity is normal in size. Normal left ventricular wall thickness. Normal global wall motion. Normal diastolic filling pattern, normal LAP.  Mild (Grade I) aortic regurgitation. Mild (Grade I) mitral regurgitation. Moderate tricuspid regurgitation. No evidence of pulmonary hypertension. RVSP measures 32 mmHg. Prior study dated 05/03/2017: LVEF 60-65%, G1DD, Mild MR, Moderate TR, PASP 23mmHG.   Stress Testing: More than 8-9 years ago at outside facility.   Heart Catheterization: 05/05/2017:   Prox LAD to Mid LAD lesion, 20 %stenosed.  Ost Cx to Prox Cx lesion, 15 %stenosed.  Prox RCA to Mid RCA lesion, 20 %stenosed.   Medical treatment with life-style modification.  LABORATORY DATA: CBC Latest Ref Rng & Units 05/05/2017 05/03/2017 05/02/2017  WBC 4.0 - 10.5 K/uL 5.6 8.6 -  Hemoglobin 12.0 - 15.0 g/dL 12.6 12.2 14.3  Hematocrit 36.0 - 46.0 % 37.9 36.5 42.0  Platelets 150 - 400 K/uL 263 229 -    CMP Latest Ref Rng & Units 09/24/2020 05/05/2017 05/03/2017  Glucose 65 - 99 mg/dL 78 81 115(H)  BUN 8 - 27 mg/dL $Remove'12 11 11  'vBykjvC$ Creatinine 0.57 - 1.00 mg/dL 0.80 0.72 0.75  Sodium 134 - 144 mmol/L 137 139 139  Potassium 3.5 - 5.2 mmol/L 4.3 3.9 3.5  Chloride 96 - 106 mmol/L 105 108 107  CO2 20 - 29 mmol/L $RemoveB'20 24 25  'jAfjTaCc$ Calcium 8.7 - 10.3 mg/dL 9.5 8.7(L) 8.7(L)  Total Protein 6.5 - 8.1 g/dL - - 5.5(L)  Total Bilirubin 0.3 - 1.2 mg/dL - - 0.3  Alkaline Phos 38 - 126 U/L - - 91  AST 15 - 41 U/L - - 33  ALT 14 - 54 U/L - - 26    Lipid Panel     Component Value Date/Time   CHOL 180 09/24/2020 0840   TRIG 84 09/24/2020 0840   HDL 79 09/24/2020 0840   CHOLHDL 2.3 08/04/2007 0450   VLDL 6 08/04/2007 0450   LDLCALC 86 09/24/2020 0840   LDLDIRECT 89 09/24/2020 0840   LABVLDL 15 09/24/2020 0840    No components found for: NTPROBNP No results for input(s): PROBNP in the last 8760 hours. No  results for input(s): TSH in the last 8760 hours.  BMP Recent Labs    09/24/20 0841  NA 137  K 4.3  CL 105  CO2 20  GLUCOSE 78  BUN 12  CREATININE 0.80  CALCIUM 9.5  GFRNONAA 74  GFRAA 86    HEMOGLOBIN A1C No results found for: HGBA1C, MPG   External Labs: Collected: 08/25/2020 [facility] urgent care Hemoglobin 14.4 g/dL, hematocrit 43% Creatinine 0.91 mg/dL. eGFR: 74 mL/min per 1.73 m AST 19, ALT 12, alkaline phosphatase 80  IMPRESSION:    ICD-10-CM   1. Nonobstructive atherosclerosis of coronary artery  I25.10 atorvastatin (LIPITOR) 20 MG tablet    CMP14+EGFR    Lipid Panel With LDL/HDL Ratio    LDL cholesterol, direct    LDL cholesterol, direct    Lipid Panel With LDL/HDL Ratio    CMP14+EGFR  2. Benign hypertension  I10   3. Encounter to discuss test results  Z71.2   4. Calcification of native coronary artery  I25.10 atorvastatin (LIPITOR) 20 MG tablet   I25.84   5. Atherosclerosis of aorta (HCC)  I70.0      RECOMMENDATIONS: VELDA WENDT is a 70 y.o. female whose past medical history and cardiac risk factors include: Ectatic thoracic aorta, nonobstructive CAD, hypertension, hypothyroidism, depression, postmenopausal female, advanced age.  Patient had a study in outside facility which noted an ectatic thoracic aorta.  Given the symptoms that she presented to the office for further evaluation the shared decision was to proceed with a CTA chest abdomen pelvis to rule out aortic syndromes and or pathology given her uncontrolled hypertension, abnormal chest x-ray, and back pain symptoms. Patient did undergo CT of the chest abdomen pelvis results reviewed with her in great detail.  The vascular findings do not note aortic pathology.  However she has multiple nonvascular findings which I have asked her to review with her PCP.   Hypertension: Patient's blood pressure today is not well controlled in the office.  However patient also forgot to take her  morning medications.  Given the concern for aortic pathology at last office visit she was started on spironolactone.  She tolerated the medication well without any side effects or intolerances.  Repeat blood work notes stable kidney function and electrolytes.  Currently managed by primary care provider.   Nonobstructive CAD:  Since last visit patient has started taking aspirin 81 mg p.o. daily. Given the nonobstructive CAD, aortic atherosclerosis, and coronary calcification also recommended statins.  Patient will be started on Lipitor 20 mg p.o. nightly.  We will repeat blood work prior to the next office visit.  Hypothyroidism: Currently managed by primary care provider.  FINAL MEDICATION LIST END OF ENCOUNTER: Meds ordered this encounter  Medications  . atorvastatin (LIPITOR) 20 MG tablet    Sig: Take 1 tablet (20 mg total) by mouth at bedtime.    Dispense:  90 tablet    Refill:  0     Current Outpatient Medications:  .  aspirin 81 MG EC tablet, Take 1 tablet (81 mg total) by mouth daily., Disp: 30 tablet, Rfl: 2 .  atorvastatin (LIPITOR) 20 MG tablet, Take 1 tablet (20 mg total) by mouth at bedtime., Disp: 90 tablet, Rfl: 0 .  clonazePAM (KLONOPIN) 0.5 MG tablet, Take 0.5 mg by mouth daily as needed for anxiety. For aneixty, Disp: , Rfl:  .  estrogens, conjugated, (PREMARIN) 0.625 MG tablet, Take 5 mg by mouth daily. Take daily for 21 days then do not take for 7 days., Disp: , Rfl:  .  eszopiclone (LUNESTA) 2 MG TABS tablet, Take 2 mg by mouth at bedtime as needed., Disp: , Rfl:  .  folic acid (FOLVITE) 1 MG tablet, Take 1 mg by mouth daily., Disp: , Rfl:  .  lamoTRIgine (LAMICTAL) 100 MG tablet, Take 100 mg by mouth every morning., Disp: , Rfl:  .  levothyroxine (SYNTHROID, LEVOTHROID) 150 MCG tablet, Take 0.75 mcg by mouth daily before breakfast. , Disp: , Rfl:  .  lurasidone (LATUDA) 20 MG TABS tablet, Take 20 mg by mouth daily., Disp: , Rfl:  .  olmesartan-hydrochlorothiazide  (BENICAR HCT) 40-12.5 MG per tablet, Take 1 tablet by mouth daily., Disp: , Rfl:  .  sertraline (ZOLOFT) 100 MG tablet, Take 200 mg by mouth daily after breakfast., Disp: , Rfl:  .  spironolactone (ALDACTONE) 25 MG tablet,  Take 1 tablet (25 mg total) by mouth in the morning., Disp: 90 tablet, Rfl: 0 .  Vitamin D, Ergocalciferol, (DRISDOL) 50000 UNITS CAPS capsule, Take 50,000 Units by mouth every 7 (seven) days., Disp: , Rfl:   Orders Placed This Encounter  Procedures  . CMP14+EGFR  . Lipid Panel With LDL/HDL Ratio  . LDL cholesterol, direct    There are no Patient Instructions on file for this visit.   --Continue cardiac medications as reconciled in final medication list. --Return in about 6 months (around 05/06/2021) for Follow up, CAD. Or sooner if needed. --Continue follow-up with your primary care physician regarding the management of your other chronic comorbid conditions.  Patient's questions and concerns were addressed to her satisfaction. She voices understanding of the instructions provided during this encounter.   This note was created using a voice recognition software as a result there may be grammatical errors inadvertently enclosed that do not reflect the nature of this encounter. Every attempt is made to correct such errors.  Rex Kras, [place], [facility]  Pager: [phone] Office: [phone]

## 2020-11-21 LAB — LDL CHOLESTEROL, DIRECT: LDL Direct: 70 mg/dL (ref 0–99)

## 2020-11-21 LAB — CMP14+EGFR
ALT: 13 IU/L (ref 0–32)
AST: 23 IU/L (ref 0–40)
Albumin/Globulin Ratio: 1.8 (ref 1.2–2.2)
Albumin: 4.4 g/dL (ref 3.7–4.7)
Alkaline Phosphatase: 93 IU/L (ref 44–121)
BUN/Creatinine Ratio: 28 (ref 12–28)
BUN: 26 mg/dL (ref 8–27)
Bilirubin Total: <0.2 mg/dL (ref 0.0–1.2)
CO2: 22 mmol/L (ref 20–29)
Calcium: 9.7 mg/dL (ref 8.7–10.3)
Chloride: 103 mmol/L (ref 96–106)
Creatinine, Ser: 0.93 mg/dL (ref 0.57–1.00)
GFR calc Af Amer: 72 mL/min/{1.73_m2} (ref >59)
GFR calc non Af Amer: 62 mL/min/{1.73_m2} (ref >59)
Globulin, Total: 2.4 g/dL (ref 1.5–4.5)
Glucose: 67 mg/dL (ref 65–99)
Potassium: 5.3 mmol/L — ABNORMAL HIGH (ref 3.5–5.2)
Sodium: 142 mmol/L (ref 134–144)
Total Protein: 6.8 g/dL (ref 6.0–8.5)

## 2020-11-21 LAB — LIPID PANEL WITH LDL/HDL RATIO
Cholesterol, Total: 179 mg/dL (ref 100–199)
HDL: 88 mg/dL (ref >39)
LDL Chol Calc (NIH): 78 mg/dL (ref 0–99)
LDL/HDL Ratio: 0.9 ratio (ref 0.0–3.2)
Triglycerides: 70 mg/dL (ref 0–149)
VLDL Cholesterol Cal: 13 mg/dL (ref 5–40)

## 2020-11-21 NOTE — Progress Notes (Signed)
Called pt to inform her about her lab results pt understood.

## 2020-11-26 LAB — CMP14+EGFR
ALT: 17 IU/L (ref 0–32)
AST: 24 IU/L (ref 0–40)
Albumin/Globulin Ratio: 1.9 (ref 1.2–2.2)
Albumin: 4.4 g/dL (ref 3.7–4.7)
Alkaline Phosphatase: 97 IU/L (ref 44–121)
BUN/Creatinine Ratio: 19 (ref 12–28)
BUN: 20 mg/dL (ref 8–27)
Bilirubin Total: <0.2 mg/dL (ref 0.0–1.2)
CO2: 20 mmol/L (ref 20–29)
Calcium: 10.2 mg/dL (ref 8.7–10.3)
Chloride: 106 mmol/L (ref 96–106)
Creatinine, Ser: 1.08 mg/dL — ABNORMAL HIGH (ref 0.57–1.00)
Globulin, Total: 2.3 g/dL (ref 1.5–4.5)
Glucose: 87 mg/dL (ref 65–99)
Potassium: 5.3 mmol/L — ABNORMAL HIGH (ref 3.5–5.2)
Sodium: 141 mmol/L (ref 134–144)
Total Protein: 6.7 g/dL (ref 6.0–8.5)
eGFR: 55 mL/min/{1.73_m2} — ABNORMAL LOW (ref >59)

## 2020-11-26 LAB — LDL CHOLESTEROL, DIRECT: LDL Direct: 62 mg/dL (ref 0–99)

## 2020-11-26 NOTE — Progress Notes (Signed)
Spoke to patient she voiced understanding

## 2020-12-03 ENCOUNTER — Other Ambulatory Visit: Payer: Self-pay | Admitting: Cardiology

## 2020-12-03 DIAGNOSIS — I10 Essential (primary) hypertension: Secondary | ICD-10-CM

## 2020-12-07 ENCOUNTER — Other Ambulatory Visit: Payer: Self-pay | Admitting: Cardiology

## 2020-12-07 DIAGNOSIS — E875 Hyperkalemia: Secondary | ICD-10-CM

## 2020-12-08 ENCOUNTER — Telehealth: Payer: Self-pay

## 2020-12-10 LAB — BASIC METABOLIC PANEL
BUN/Creatinine Ratio: 15 (ref 12–28)
BUN: 13 mg/dL (ref 8–27)
CO2: 15 mmol/L — ABNORMAL LOW (ref 20–29)
Calcium: 9.9 mg/dL (ref 8.7–10.3)
Chloride: 104 mmol/L (ref 96–106)
Creatinine, Ser: 0.87 mg/dL (ref 0.57–1.00)
Glucose: 86 mg/dL (ref 65–99)
Potassium: 5.3 mmol/L — ABNORMAL HIGH (ref 3.5–5.2)
Sodium: 140 mmol/L (ref 134–144)
eGFR: 71 mL/min/{1.73_m2} (ref >59)

## 2020-12-10 NOTE — Telephone Encounter (Signed)
done

## 2020-12-29 NOTE — Progress Notes (Signed)
No answer left a vm

## 2020-12-30 NOTE — Progress Notes (Signed)
Spoke to patient she said she had to reschedule her appt w/ her pcp due to work patient she is going this month patient wasn't able to remember the exact date but once she goes she will let them know

## 2021-01-08 ENCOUNTER — Other Ambulatory Visit: Payer: Self-pay

## 2021-01-08 DIAGNOSIS — E875 Hyperkalemia: Secondary | ICD-10-CM

## 2021-01-08 DIAGNOSIS — I2510 Atherosclerotic heart disease of native coronary artery without angina pectoris: Secondary | ICD-10-CM

## 2021-01-08 DIAGNOSIS — I10 Essential (primary) hypertension: Secondary | ICD-10-CM

## 2021-01-08 NOTE — Progress Notes (Signed)
Spoke to patient she doesn't have her appt till April 30 but she is willing to go get her bmp done I went ahead and placed it

## 2021-01-12 ENCOUNTER — Telehealth: Payer: Self-pay

## 2021-01-12 ENCOUNTER — Other Ambulatory Visit: Payer: Self-pay

## 2021-01-12 DIAGNOSIS — I10 Essential (primary) hypertension: Secondary | ICD-10-CM

## 2021-01-12 DIAGNOSIS — I2584 Coronary atherosclerosis due to calcified coronary lesion: Secondary | ICD-10-CM

## 2021-01-12 DIAGNOSIS — I2510 Atherosclerotic heart disease of native coronary artery without angina pectoris: Secondary | ICD-10-CM

## 2021-01-14 LAB — BASIC METABOLIC PANEL
BUN/Creatinine Ratio: 12 (ref 12–28)
BUN: 11 mg/dL (ref 8–27)
CO2: 18 mmol/L — ABNORMAL LOW (ref 20–29)
Calcium: 9.7 mg/dL (ref 8.7–10.3)
Chloride: 105 mmol/L (ref 96–106)
Creatinine, Ser: 0.91 mg/dL (ref 0.57–1.00)
Glucose: 90 mg/dL (ref 65–99)
Potassium: 5.3 mmol/L — ABNORMAL HIGH (ref 3.5–5.2)
Sodium: 140 mmol/L (ref 134–144)
eGFR: 67 mL/min/{1.73_m2} (ref >59)

## 2021-01-15 ENCOUNTER — Other Ambulatory Visit: Payer: Self-pay | Admitting: Cardiology

## 2021-01-19 NOTE — Addendum Note (Signed)
Encounter addended by: Annie Paras on: 01/19/2021 12:30 PM  Actions taken: Letter saved

## 2021-01-21 ENCOUNTER — Other Ambulatory Visit: Payer: Self-pay | Admitting: Cardiology

## 2021-01-21 DIAGNOSIS — I10 Essential (primary) hypertension: Secondary | ICD-10-CM

## 2021-01-21 MED ORDER — HYDROCHLOROTHIAZIDE 25 MG PO TABS
25.0000 mg | ORAL_TABLET | Freq: Every morning | ORAL | 0 refills | Status: DC
Start: 2021-01-21 — End: 2021-04-21

## 2021-01-21 MED ORDER — OLMESARTAN MEDOXOMIL 40 MG PO TABS
40.0000 mg | ORAL_TABLET | Freq: Every evening | ORAL | 0 refills | Status: DC
Start: 2021-01-21 — End: 2021-04-21

## 2021-01-22 NOTE — Telephone Encounter (Signed)
done

## 2021-01-22 NOTE — Progress Notes (Signed)
Patient is aware 

## 2021-02-06 LAB — MAGNESIUM: Magnesium: 1.9 mg/dL (ref 1.6–2.3)

## 2021-02-06 LAB — BASIC METABOLIC PANEL
BUN/Creatinine Ratio: 18 (ref 12–28)
BUN: 16 mg/dL (ref 8–27)
CO2: 17 mmol/L — ABNORMAL LOW (ref 20–29)
Calcium: 9.9 mg/dL (ref 8.7–10.3)
Chloride: 103 mmol/L (ref 96–106)
Creatinine, Ser: 0.88 mg/dL (ref 0.57–1.00)
Glucose: 85 mg/dL (ref 65–99)
Potassium: 4.9 mmol/L (ref 3.5–5.2)
Sodium: 141 mmol/L (ref 134–144)
eGFR: 70 mL/min/{1.73_m2} (ref >59)

## 2021-02-10 NOTE — Progress Notes (Signed)
Spoke to patient

## 2021-03-02 ENCOUNTER — Telehealth: Payer: Self-pay

## 2021-03-02 NOTE — Telephone Encounter (Signed)
As per the last office note it  is Lipitor 20 mg p.o. nightly

## 2021-03-02 NOTE — Telephone Encounter (Signed)
Pt called regarding her atorvastatin. She stated that her PCP sent in a written prescription for atorvastatin 10mg . Is she suppose to be on 20mg  or 10mg ?

## 2021-03-03 ENCOUNTER — Other Ambulatory Visit: Payer: Self-pay

## 2021-03-03 DIAGNOSIS — I2510 Atherosclerotic heart disease of native coronary artery without angina pectoris: Secondary | ICD-10-CM

## 2021-03-03 MED ORDER — ATORVASTATIN CALCIUM 20 MG PO TABS: 20.0000 mg | ORAL_TABLET | Freq: Every evening | ORAL | 0 refills | Status: DC

## 2021-03-03 NOTE — Telephone Encounter (Signed)
Sent in prescription for Lipitor 20mg  p.o. nightly. Pt is going to finish up her 10mg  tablets and then start the 20mg  tablets.

## 2021-04-21 ENCOUNTER — Other Ambulatory Visit: Payer: Self-pay | Admitting: Cardiology

## 2021-04-21 DIAGNOSIS — I10 Essential (primary) hypertension: Secondary | ICD-10-CM

## 2021-05-07 ENCOUNTER — Ambulatory Visit: Admitting: Cardiology

## 2021-05-18 ENCOUNTER — Other Ambulatory Visit: Payer: Self-pay | Admitting: Cardiology

## 2021-05-18 ENCOUNTER — Telehealth: Payer: Self-pay | Admitting: Cardiology

## 2021-05-18 DIAGNOSIS — I2510 Atherosclerotic heart disease of native coronary artery without angina pectoris: Secondary | ICD-10-CM

## 2021-05-18 NOTE — Telephone Encounter (Signed)
This was done.

## 2021-05-18 NOTE — Telephone Encounter (Signed)
Pt says she lost a medication (she doesn't know the name of which one it is). Pt wants to speak w/medical assistant about a list of her medications & also says the med she lost is one that is written and sent to her in the mail. Contact her at the [phone] #.

## 2021-05-28 ENCOUNTER — Other Ambulatory Visit: Payer: Self-pay

## 2021-05-28 ENCOUNTER — Ambulatory Visit: Payer: Medicare PPO | Admitting: Cardiology

## 2021-05-28 ENCOUNTER — Encounter: Payer: Self-pay | Admitting: Cardiology

## 2021-05-28 VITALS — BP 132/92 | HR 80 | Temp 98.0°F | Resp 16 | Ht 63.0 in | Wt 148.0 lb

## 2021-05-28 DIAGNOSIS — I10 Essential (primary) hypertension: Secondary | ICD-10-CM

## 2021-05-28 DIAGNOSIS — R9431 Abnormal electrocardiogram [ECG] [EKG]: Secondary | ICD-10-CM

## 2021-05-28 DIAGNOSIS — I700 Atherosclerosis of aorta: Secondary | ICD-10-CM

## 2021-05-28 DIAGNOSIS — I2510 Atherosclerotic heart disease of native coronary artery without angina pectoris: Secondary | ICD-10-CM

## 2021-05-28 DIAGNOSIS — I2584 Coronary atherosclerosis due to calcified coronary lesion: Secondary | ICD-10-CM

## 2021-05-28 MED ORDER — ATORVASTATIN CALCIUM 20 MG PO TABS: 20.0000 mg | ORAL_TABLET | Freq: Every day | ORAL | 0 refills | Status: DC

## 2021-05-28 MED ORDER — HYDROCHLOROTHIAZIDE 25 MG PO TABS: ORAL_TABLET | ORAL | 0 refills | Status: DC

## 2021-05-28 MED ORDER — HYDROCHLOROTHIAZIDE 25 MG PO TABS: ORAL_TABLET | ORAL | 0 refills | Status: AC

## 2021-05-28 NOTE — Progress Notes (Signed)
ID:  Emily Murray, Emily Murray [birth year], MRN [id number]  PCP:  Vincente Liberty, MD  Cardiologist:  Rex Kras, DO, [facility] (established care 09/11/2020) Former Cardiology Providers: Dixie Dials   Date: 05/28/21 Last Office Visit: 11/06/2020   Chief Complaint  Patient presents with   Nonobstructive atherosclerosis of coronary artery   Follow-up    HPI  Emily Murray is a 70 y.o. female who presents to the office with a chief complaint of " 59-month follow-up history of nonobstructive CAD and aortic atherosclerosis." Patient's past medical history and cardiovascular risk factors include:  nonobstructive CAD, aortic atherosclerosis, hypertension, hypothyroidism, depression, postmenopausal female, advanced age.  She is referred to the office at the request of Vincente Liberty, MD for evaluation of ectatic thoracic aorta.  The patient was formally under the care of Dr. Doylene Canard and had a heart catheterization in 2018 and noted to have nonobstructive CAD.  She was referred to our practice back in December 2021 after having a chest x-ray at [facility] urgent care which noted moderate degree of ectasia of the thoracic aorta.  Patient's medications were titrated to optimize her blood pressures; however, she continues to have back pain between the shoulder blades and underwent CT to rule out aortic syndromes.  At the last office visit she was made aware that the CTA noted no findings of aortic aneurysm or dissection and the thoracic and abdominal aorta is within limits.  She did have nonvascular findings that were discussed with her and she was encouraged to follow-up with PCP for further evaluation and management.  Since last office visit patient states that she is doing well from a cardiovascular standpoint.  She denies any chest pain at rest or with effort related activities.  Denies any shortness of breath.  She is overall euvolemic.  No hospitalizations or urgent care visits since  last office encounter.  FUNCTIONAL STATUS: Stationary bike 3-5x per week for 20 minutes, gardening.  ALLERGIES: Allergies  Allergen Reactions   Azo Cranberry [Cranberry Extract]     Caused numbness in lip   Cranberry Other (See Comments)    Patient took Cranberry tablets and her lips "went numb"    MEDICATION LIST PRIOR TO VISIT: Current Meds  Medication Sig   aspirin 81 MG EC tablet Take 1 tablet (81 mg total) by mouth daily.   clonazePAM (KLONOPIN) 0.5 MG tablet Take 0.5 mg by mouth daily as needed for anxiety. For aneixty   estrogens, conjugated, (PREMARIN) 0.625 MG tablet Take 5 mg by mouth daily. Take daily for 21 days then do not take for 7 days.   eszopiclone (LUNESTA) 2 MG TABS tablet Take 2 mg by mouth at bedtime as needed.   folic acid (FOLVITE) 1 MG tablet Take 1 mg by mouth daily.   lamoTRIgine (LAMICTAL) 100 MG tablet Take 100 mg by mouth every morning.   levothyroxine (SYNTHROID, LEVOTHROID) 150 MCG tablet Take 0.75 mcg by mouth daily before breakfast.    lurasidone (LATUDA) 20 MG TABS tablet Take 20 mg by mouth daily.   olmesartan (BENICAR) 40 MG tablet TAKE 1 TABLET BY MOUTH EVERY DAY IN THE EVENING   sertraline (ZOLOFT) 100 MG tablet Take 200 mg by mouth daily after breakfast.   Vitamin D, Ergocalciferol, (DRISDOL) 50000 UNITS CAPS capsule Take 50,000 Units by mouth every 7 (seven) days.   [DISCONTINUED] atorvastatin (LIPITOR) 20 MG tablet TAKE ONE TABLET BY MOUTH EVERY DAY AT BEDTIME   [DISCONTINUED] hydrochlorothiazide (HYDRODIURIL) 25 MG tablet TAKE 1 TABLET  BY MOUTH EVERY DAY IN THE MORNING     PAST MEDICAL HISTORY: Past Medical History:  Diagnosis Date   Anxiety    Bipolar 1 disorder ([place])    Depressed    Hypertension    Nonobstructive atherosclerosis of coronary artery     PAST SURGICAL HISTORY: Past Surgical History:  Procedure Laterality Date   ABDOMINAL HYSTERECTOMY     LEFT HEART CATH AND CORONARY ANGIOGRAPHY N/A 05/05/2017   Procedure: LEFT  HEART CATH AND CORONARY ANGIOGRAPHY;  Surgeon: Dixie Dials, MD;  Location: [place] [facility];  Service: Cardiovascular;  Laterality: N/A;   rotator cuff repair Right 2015    FAMILY HISTORY: The patient family history includes Arrhythmia in her son; Diabetes in her son; Heart disease in her mother; Hypertension in her mother; Prostate cancer in her father.  SOCIAL HISTORY:  The patient  reports that she quit smoking about 28 years ago. Her smoking use included cigarettes. She has a 12.50 pack-year smoking history. She has never used smokeless tobacco. She reports that she does not drink alcohol and does not use drugs.  Review of Systems  Constitutional: Negative for chills and fever.  HENT:  Negative for hoarse voice and nosebleeds.   Eyes:  Negative for discharge, double vision and pain.  Cardiovascular:  Negative for chest pain, claudication, dyspnea on exertion, leg swelling, near-syncope, orthopnea, palpitations, paroxysmal nocturnal dyspnea and syncope.  Respiratory:  Negative for hemoptysis and shortness of breath.   Musculoskeletal:  Positive for joint pain. Negative for back pain, muscle cramps and myalgias.  Gastrointestinal:  Negative for abdominal pain, constipation, diarrhea, hematemesis, hematochezia, melena, nausea and vomiting.  Neurological:  Negative for dizziness and light-headedness.   PHYSICAL EXAM: Vitals with BMI 05/28/2021 05/28/2021 11/06/2020  Height - $Remove'5\' 3"'SFLIFmC$  $RemoveB'5\' 3"'VvADMtqf$   Weight - 148 lbs 147 lbs 13 oz  BMI - 12.87 86.76  Systolic 720 947 096  Diastolic 92 97 82  Pulse 80 81 79    CONSTITUTIONAL: Well-developed and well-nourished. No acute distress.  SKIN: Skin is warm and dry. No rash noted. No cyanosis. No pallor. No jaundice HEAD: Normocephalic and atraumatic.  EYES: No scleral icterus MOUTH/THROAT: Moist oral membranes.  NECK: No JVD present. No thyromegaly noted. No carotid bruits  LYMPHATIC: No visible cervical adenopathy.  CHEST Normal respiratory effort. No  intercostal retractions  LUNGS: Clear to auscultation bilaterally. No stridor. No wheezes. No rales.  CARDIOVASCULAR: Regular rate and rhythm, positive S1-S2, no murmurs rubs or gallops appreciated. ABDOMINAL: No apparent ascites.  EXTREMITIES: No peripheral edema  HEMATOLOGIC: No significant bruising NEUROLOGIC: Oriented to person, place, and time. Nonfocal. Normal muscle tone.  PSYCHIATRIC: Normal mood and affect. Normal behavior. Cooperative  RADIOLOGY: Chest x-ray [facility] at urgent care 08/25/2020: Heart normal size. Moderate to marked ectasia of the thoracic aorta.  Thoracolumbar levoscoliosis.  No active disease  CTA Chest/ ABD/ Pelvis 09/2020: Vascular: 1. Negative for an aortic aneurysm or dissection. Normal caliber of the thoracic and abdominal aorta. 2. Distal abdominal aorta and left common iliac artery are tortuous. 3. Aortic Atherosclerosis (ICD10-I70.0).  Nonvascular: 1. Innumerable tiny nodular densities scattered throughout both lungs. Largest nodules measure 3 mm. No comparison chest CT to evaluate the stability of this finding. Suspect these tiny nodules are infectious or inflammatory but indeterminate. Slightly prominent mediastinal soft tissue, particularly near the AP window and this could represent mild lymphadenopathy. Recommend follow-up chest CT with IV contrast (standard CT) in 3 months to evaluate the mediastinum and follow-up these tiny  nodules to exclude a neoplastic process. 2. Three foci of presumed enhancement in the liver. These small enhancing lesions are indeterminate but differential diagnosis includes flash filling hemangiomas. Recommend more definitive evaluation with liver MRI, with and without contrast. 3. Cecum is located in the left upper abdomen and findings are suggestive for a mobile cecum. No acute bowel abnormalities. 4. Large duodenal diverticulum. Main pancreatic duct is mildly dilated and the duodenum diverticulum may be contributing  to this finding. 5. Degenerative changes in the lumbar spine with narrowing of the central canal at L4-L5.  CARDIAC DATABASE: EKG: 09/11/2020: Normal sinus rhythm, 70 bpm, normal axis, poor R wave progression, without underlying injury pattern. 05/28/2021: Normal sinus rhythm, 84 bpm, T wave inversion in inferolateral leads, without undelrying injury pattern.   Echocardiogram: 09/23/2020: Normal LV systolic function with visual EF 60-65%. Left ventricle cavity is normal in size. Normal left ventricular wall thickness. Normal global wall motion. Normal diastolic filling pattern, normal LAP.  Mild (Grade I) aortic regurgitation. Mild (Grade I) mitral regurgitation. Moderate tricuspid regurgitation. No evidence of pulmonary hypertension. RVSP measures 32 mmHg. Prior study dated 05/03/2017: LVEF 60-65%, G1DD, Mild MR, Moderate TR, PASP 24mmHG.   Stress Testing: More than 8-9 years ago at outside facility.   Heart Catheterization: 05/05/2017:  Prox LAD to Mid LAD lesion, 20 %stenosed. Ost Cx to Prox Cx lesion, 15 %stenosed. Prox RCA to Mid RCA lesion, 20 %stenosed.   Medical treatment with life-style modification.  LABORATORY DATA: CBC Latest Ref Rng & Units 05/05/2017 05/03/2017 05/02/2017  WBC 4.0 - 10.5 K/uL 5.6 8.6 -  Hemoglobin 12.0 - 15.0 g/dL 12.6 12.2 14.3  Hematocrit 36.0 - 46.0 % 37.9 36.5 42.0  Platelets 150 - 400 K/uL 263 229 -    CMP Latest Ref Rng & Units 02/05/2021 01/13/2021 12/09/2020  Glucose 65 - 99 mg/dL 85 90 86  BUN 8 - 27 mg/dL $Remove'16 11 13  'ymGArPR$ Creatinine 0.57 - 1.00 mg/dL 0.88 0.91 0.87  Sodium 134 - 144 mmol/L 141 140 140  Potassium 3.5 - 5.2 mmol/L 4.9 5.3(H) 5.3(H)  Chloride 96 - 106 mmol/L 103 105 104  CO2 20 - 29 mmol/L 17(L) 18(L) 15(L)  Calcium 8.7 - 10.3 mg/dL 9.9 9.7 9.9  Total Protein 6.0 - 8.5 g/dL - - -  Total Bilirubin 0.0 - 1.2 mg/dL - - -  Alkaline Phos 44 - 121 IU/L - - -  AST 0 - 40 IU/L - - -  ALT 0 - 32 IU/L - - -    Lipid Panel     Component  Value Date/Time   CHOL 179 11/20/2020 1154   TRIG 70 11/20/2020 1154   HDL 88 11/20/2020 1154   CHOLHDL 2.3 08/04/2007 0450   VLDL 6 08/04/2007 0450   LDLCALC 78 11/20/2020 1154   LDLDIRECT 62 11/25/2020 1634   LABVLDL 13 11/20/2020 1154    No components found for: NTPROBNP No results for input(s): PROBNP in the last 8760 hours. No results for input(s): TSH in the last 8760 hours.  BMP Recent Labs    09/24/20 0841 11/20/20 1154 11/25/20 1633 12/09/20 1535 01/13/21 0904 02/05/21 1129  NA 137 142   < > 140 140 141  K 4.3 5.3*   < > 5.3* 5.3* 4.9  CL 105 103   < > 104 105 103  CO2 20 22   < > 15* 18* 17*  GLUCOSE 78 67   < > 86 90 85  BUN 12 26   < >  $'13 11 16  'r$ CREATININE 0.80 0.93   < > 0.87 0.91 0.88  CALCIUM 9.5 9.7   < > 9.9 9.7 9.9  GFRNONAA 74 62  --   --   --   --   GFRAA 86 72  --   --   --   --    < > = values in this interval not displayed.    HEMOGLOBIN A1C No results found for: HGBA1C, MPG   External Labs: Collected: 08/25/2020 [facility] urgent care Hemoglobin 14.4 g/dL, hematocrit 43% Creatinine 0.91 mg/dL. eGFR: 74 mL/min per 1.73 m AST 19, ALT 12, alkaline phosphatase 80  IMPRESSION:    ICD-10-CM   1. Calcification of native coronary artery  I25.10 PCV MYOCARDIAL PERFUSION WITH LEXISCAN   I25.84 DISCONTINUED: atorvastatin (LIPITOR) 20 MG tablet    2. Nonobstructive atherosclerosis of coronary artery  I25.10 PCV MYOCARDIAL PERFUSION WITH LEXISCAN    DISCONTINUED: atorvastatin (LIPITOR) 20 MG tablet    3. Nonspecific abnormal electrocardiogram (ECG) (EKG)  R94.31 PCV MYOCARDIAL PERFUSION WITH LEXISCAN    4. Atherosclerosis of aorta (HCC)  I70.0     5. Benign hypertension  I10 EKG 12-Lead    DISCONTINUED: hydrochlorothiazide (HYDRODIURIL) 25 MG tablet       RECOMMENDATIONS: Emily Murray is a 70 y.o. female whose past medical history and cardiac risk factors include: nonobstructive CAD, aortic atherosclerosis, hypertension,  hypothyroidism, depression, postmenopausal female, advanced age.  Calcification of native coronary artery Continue aspirin and statin therapy EKG today shows normal sinus rhythm with T wave inversions in the inferolateral leads which appear to be new compared to prior. Last ischemic evaluation greater than 5 years ago. Will check Lexiscan to evaluate for reversible ischemia.  Patient is unable to exercise as she has osteoarthritis in the knee and is currently getting hyaluronic/cortisone injections (per patient)  Nonobstructive atherosclerosis of coronary artery Based on the last left heart catheterization from 2018.  Results noted above. Given the EKG findings patient is agreeable to proceed with Lexiscan to evaluate for CAD.  Atherosclerosis of aorta (HCC) Continue aspirin and statin therapy.  Benign hypertension Office blood pressures are within acceptable range. Patient states that her home blood pressures are better controlled. Medications reconciled. Currently managed by primary care provider.  Patient had a CTA chest abdomen pelvis to further evaluate the aorta back in January 2022.  Patient also has multiple nonvascular findings I have asked her during prior office visit and also today to make sure that she follows up with her PCP to address them and to see if additional work-up is warranted.  She verbalized understanding.  FINAL MEDICATION LIST END OF ENCOUNTER: Meds ordered this encounter  Medications   DISCONTD: hydrochlorothiazide (HYDRODIURIL) 25 MG tablet    Sig: TAKE 1 TABLET BY MOUTH EVERY DAY IN THE MORNING    Dispense:  90 tablet    Refill:  0   DISCONTD: atorvastatin (LIPITOR) 20 MG tablet    Sig: Take 1 tablet (20 mg total) by mouth at bedtime.    Dispense:  90 tablet    Refill:  0     Current Outpatient Medications:    aspirin 81 MG EC tablet, Take 1 tablet (81 mg total) by mouth daily., Disp: 30 tablet, Rfl: 2   clonazePAM (KLONOPIN) 0.5 MG tablet, Take 0.5  mg by mouth daily as needed for anxiety. For aneixty, Disp: , Rfl:    estrogens, conjugated, (PREMARIN) 0.625 MG tablet, Take 5 mg by mouth daily. Take  daily for 21 days then do not take for 7 days., Disp: , Rfl:    eszopiclone (LUNESTA) 2 MG TABS tablet, Take 2 mg by mouth at bedtime as needed., Disp: , Rfl:    folic acid (FOLVITE) 1 MG tablet, Take 1 mg by mouth daily., Disp: , Rfl:    lamoTRIgine (LAMICTAL) 100 MG tablet, Take 100 mg by mouth every morning., Disp: , Rfl:    levothyroxine (SYNTHROID, LEVOTHROID) 150 MCG tablet, Take 0.75 mcg by mouth daily before breakfast. , Disp: , Rfl:    lurasidone (LATUDA) 20 MG TABS tablet, Take 20 mg by mouth daily., Disp: , Rfl:    olmesartan (BENICAR) 40 MG tablet, TAKE 1 TABLET BY MOUTH EVERY DAY IN THE EVENING, Disp: 90 tablet, Rfl: 0   sertraline (ZOLOFT) 100 MG tablet, Take 200 mg by mouth daily after breakfast., Disp: , Rfl:    Vitamin D, Ergocalciferol, (DRISDOL) 50000 UNITS CAPS capsule, Take 50,000 Units by mouth every 7 (seven) days., Disp: , Rfl:    atorvastatin (LIPITOR) 20 MG tablet, Take 1 tablet (20 mg total) by mouth at bedtime., Disp: 90 tablet, Rfl: 0   hydrochlorothiazide (HYDRODIURIL) 25 MG tablet, TAKE 1 TABLET BY MOUTH EVERY DAY IN THE MORNING, Disp: 90 tablet, Rfl: 0  Orders Placed This Encounter  Procedures   PCV MYOCARDIAL PERFUSION WITH LEXISCAN   EKG 12-Lead    There are no Patient Instructions on file for this visit.   --Continue cardiac medications as reconciled in final medication list. --Return in about 6 months (around 11/25/2021) for Follow up, Coronary artery calcification. Or sooner if needed. --Continue follow-up with your primary care physician regarding the management of your other chronic comorbid conditions.  Patient's questions and concerns were addressed to her satisfaction. She voices understanding of the instructions provided during this encounter.   This note was created using a voice recognition  software as a result there may be grammatical errors inadvertently enclosed that do not reflect the nature of this encounter. Every attempt is made to correct such errors.  Rex Kras, [place], [facility]  Pager: [phone] Office: [phone]

## 2021-06-29 ENCOUNTER — Other Ambulatory Visit: Payer: Medicare PPO

## 2021-07-06 ENCOUNTER — Other Ambulatory Visit: Payer: Medicare PPO

## 2021-07-22 ENCOUNTER — Ambulatory Visit: Payer: Medicare PPO

## 2021-07-22 ENCOUNTER — Other Ambulatory Visit: Payer: Self-pay

## 2021-07-22 DIAGNOSIS — I2510 Atherosclerotic heart disease of native coronary artery without angina pectoris: Secondary | ICD-10-CM

## 2021-07-22 DIAGNOSIS — R9431 Abnormal electrocardiogram [ECG] [EKG]: Secondary | ICD-10-CM

## 2021-08-03 NOTE — Progress Notes (Signed)
Called pt no answer, left a vm

## 2021-08-04 NOTE — Progress Notes (Signed)
Patient aware.

## 2021-08-06 ENCOUNTER — Ambulatory Visit: Admitting: Podiatry

## 2021-08-07 ENCOUNTER — Encounter: Payer: Self-pay | Admitting: Podiatry

## 2021-08-07 ENCOUNTER — Ambulatory Visit (INDEPENDENT_AMBULATORY_CARE_PROVIDER_SITE_OTHER): Payer: Medicare PPO | Admitting: Podiatry

## 2021-08-07 ENCOUNTER — Other Ambulatory Visit: Payer: Self-pay

## 2021-08-07 DIAGNOSIS — L84 Corns and callosities: Secondary | ICD-10-CM

## 2021-08-07 DIAGNOSIS — M2041 Other hammer toe(s) (acquired), right foot: Secondary | ICD-10-CM

## 2021-08-09 NOTE — Progress Notes (Signed)
Subjective:   Patient ID: Emily Murray, female   DOB: 69 y.o.   MRN: [id number]   HPI Patient presents with a very painful fourth digit of the right foot with elevated toe with history of having had other toes corrected in the past with keratotic lesion that makes shoe gear difficult and does not remember injury.  States its been present for around 6 months.  Patient does not smoke likes to be active   Review of Systems  All other systems reviewed and are negative.      Objective:  Physical Exam Vitals and nursing note reviewed.  Constitutional:      Appearance: She is well-developed.  Pulmonary:     Effort: Pulmonary effort is normal.  Musculoskeletal:        General: Normal range of motion.  Skin:    General: Skin is warm.  Neurological:     Mental Status: She is alert.    Neurovascular status intact muscle strength adequate range of motion adequate with patient found to have an elevated fourth digit right with keratotic lesion formation that is painful thickened in its nature and hard to wear shoe gear with.  Previous toes have been corrected third and fifth digits.  Patient has good digital perfusion well oriented x3     Assessment:  Hammertoe deformity digit 4 right with structural changes bone changes noted     Plan:  H&P reviewed condition and aggressive debridement accomplished today along with padding and I reviewed possibility for arthroplasty educating her on procedure and risk and we will hold off and do depending on the response to this conservative treatment that I provided today

## 2021-10-19 ENCOUNTER — Other Ambulatory Visit: Payer: Self-pay

## 2021-10-19 DIAGNOSIS — I2510 Atherosclerotic heart disease of native coronary artery without angina pectoris: Secondary | ICD-10-CM

## 2021-10-19 MED ORDER — ATORVASTATIN CALCIUM 20 MG PO TABS
20.0000 mg | ORAL_TABLET | Freq: Every day | ORAL | 3 refills | Status: DC
Start: 2021-10-19 — End: 2022-10-04

## 2021-11-11 ENCOUNTER — Ambulatory Visit: Admitting: Podiatry

## 2021-11-30 ENCOUNTER — Ambulatory Visit: Payer: Medicare Other | Admitting: Cardiology

## 2022-02-10 ENCOUNTER — Ambulatory Visit: Payer: Medicare Other | Admitting: Cardiology

## 2022-02-10 ENCOUNTER — Encounter: Payer: Self-pay | Admitting: Cardiology

## 2022-02-10 VITALS — BP 160/99 | HR 93 | Temp 97.3°F | Resp 16 | Ht 63.0 in | Wt 141.0 lb

## 2022-02-10 DIAGNOSIS — I10 Essential (primary) hypertension: Secondary | ICD-10-CM

## 2022-02-10 DIAGNOSIS — Z01810 Encounter for preprocedural cardiovascular examination: Secondary | ICD-10-CM

## 2022-02-10 DIAGNOSIS — I700 Atherosclerosis of aorta: Secondary | ICD-10-CM

## 2022-02-10 DIAGNOSIS — I2510 Atherosclerotic heart disease of native coronary artery without angina pectoris: Secondary | ICD-10-CM

## 2022-02-10 NOTE — Progress Notes (Signed)
? ?ID:  Ranisha, Allaire 02-03-1951, MRN [id number] ? ?PCP:  Vincente Liberty, MD  ?Cardiologist:  Rex Kras, DO, [facility] (established care 09/11/2020) ?Former Cardiology Providers: Dixie Dials  ? ?Date: 02/10/22 ?Last Office Visit: May 28, 2021 ? ? ?Chief Complaint  ?Patient presents with  ? Follow-up  ? Pre-op Exam  ? ? ?HPI  ?FRANCOISE CHOJNOWSKI is a 71 y.o. female whose past medical history and cardiovascular risk factors include:  nonobstructive CAD, aortic atherosclerosis, hypertension, hypothyroidism, depression, postmenopausal female, advanced age. ? ?Referred to the practice for evaluation and ectatic thoracic aorta.  She underwent CTA chest abdomen pelvis dissection protocol in January 2022 which reported no evidence of aortic aneurysm or dissection.  Normal caliber of the thoracic and abdominal aorta.  Distal abdominal aorta and left common iliac arteries are tortuous and aortic atherosclerosis. ? ?She was followed by Dr. Doylene Canard in the past and had a heart catheterization in 2018 which noted nonobstructive CAD.  Since last visit she underwent a modified Bruce protocol Lexi stress test which noted no obvious reversible ischemia or prior infarct.  Hypertensive response to exercise noted. ? ?She now presents for almost 41-monthfollow-up visit.  Patient is planning [facility].  She is referred to cardiology for further evaluation and preop risk stratification. ? ?She denies angina pectoris or heart failure symptoms she recently has undergone an echo and stress test results reviewed and noted below for further reference.  Her blood pressure today is not well controlled and for reasons unknown she stopped taking her antihypertensive medications.  Patient stated she was told by someone to stop taking them.  I have encouraged her to restart the antihypertensive medications and follow-up with  PCP. ? ?FUNCTIONAL STATUS: ?Stationary bike 3-5x per week for 20 minutes, gardening. ? ?ALLERGIES: ?Allergies  ?Allergen Reactions  ? Azo Cranberry [Cranberry Extract]   ?  Caused numbness in lip  ? Cranberry Other (See Comments)  ?  Patient took Cranberry tablets and her lips "went numb"  ? ? ?MEDICATION LIST PRIOR TO VISIT: ?Current Meds  ?Medication Sig  ? aspirin 81 MG EC tablet Take 1 tablet (81 mg total) by mouth daily.  ? atorvastatin (LIPITOR) 20 MG tablet Take 1 tablet (20 mg total) by mouth at bedtime.  ? clonazePAM (KLONOPIN) 0.5 MG tablet Take 0.5 mg by mouth daily as needed for anxiety. For aneixty  ? estrogens, conjugated, (PREMARIN) 0.625 MG tablet Take 5 mg by mouth daily. Take daily for 21 days then do not take for 7 days.  ? eszopiclone (LUNESTA) 2 MG TABS tablet Take 2 mg by mouth at bedtime as needed.  ? folic acid (FOLVITE) 1 MG tablet Take 1 mg by mouth daily.  ? lamoTRIgine (LAMICTAL) 100 MG tablet Take 100 mg by mouth every morning.  ? levothyroxine (SYNTHROID, LEVOTHROID) 150 MCG tablet Take 0.75 mcg by mouth daily before breakfast.   ? lurasidone (LATUDA) 20 MG TABS tablet Take 20 mg by mouth daily.  ? sertraline (ZOLOFT) 100 MG tablet Take 200 mg by mouth daily after breakfast.  ? Vitamin D, Ergocalciferol, (DRISDOL) 50000 UNITS CAPS capsule Take 50,000 Units by mouth every 7 (seven) days.  ?  ? ?PAST MEDICAL HISTORY: ?Past Medical History:  ?Diagnosis Date  ? Anxiety   ? Bipolar 1 disorder ([place]   ? Depressed   ? Hypertension   ? Nonobstructive atherosclerosis of  coronary artery   ? ? ?PAST SURGICAL HISTORY: ?Past Surgical History:  ?Procedure Laterality Date  ? ABDOMINAL HYSTERECTOMY    ? LEFT HEART CATH AND CORONARY ANGIOGRAPHY N/A 05/05/2017  ? Procedure: LEFT HEART CATH AND CORONARY ANGIOGRAPHY;  Surgeon: Dixie Dials, MD;  Location: [facility];  Service: Cardiovascular;  Laterality: N/A;  ? rotator cuff repair Right 2015  ? ? ?FAMILY HISTORY: ?The patient family history  includes Arrhythmia in her son; Diabetes in her son; Heart disease in her mother; Hypertension in her mother; Prostate cancer in her father. ? ?SOCIAL HISTORY:  ?The patient  reports that she quit smoking about 29 years ago. Her smoking use included cigarettes. She has a 12.50 pack-year smoking history. She has never used smokeless tobacco. She reports that she does not drink alcohol and does not use drugs. ? ?Review of Systems  ?Cardiovascular:  Negative for chest pain, dyspnea on exertion, leg swelling, near-syncope, orthopnea, palpitations, paroxysmal nocturnal dyspnea and syncope.  ?Respiratory:  Negative for shortness of breath.   ?Hematologic/Lymphatic: Negative for bleeding problem.  ?Musculoskeletal:  Positive for arthritis (knee pain) and joint pain.  ? ?PHYSICAL EXAM: ? ?  02/10/2022  ? 10:59 AM 02/10/2022  ? 10:58 AM 05/28/2021  ?  3:49 PM  ?Vitals with BMI  ?Height  '5\' 3"'$    ?Weight  141 lbs   ?BMI  24.98   ?Systolic 270 623 762  ?Diastolic 99 831 92  ?Pulse 93 106 80  ? ? ?CONSTITUTIONAL: Well-developed and well-nourished. No acute distress.  ?SKIN: Skin is warm and dry. No rash noted. No cyanosis. No pallor. No jaundice ?HEAD: Normocephalic and atraumatic.  ?EYES: No scleral icterus ?MOUTH/THROAT: Moist oral membranes.  ?NECK: No JVD present. No thyromegaly noted. No carotid bruits  ?CHEST Normal respiratory effort. No intercostal retractions  ?LUNGS: Clear to auscultation bilaterally. No stridor. No wheezes. No rales.  ?CARDIOVASCULAR: Regular rate and rhythm, positive S1-S2, no murmurs rubs or gallops appreciated. ?ABDOMINAL: Obese, soft, nontender, nondistended, positive bowel sounds all 4 quadrants. No apparent ascites.  ?EXTREMITIES: No peripheral edema  ?HEMATOLOGIC: No significant bruising ?NEUROLOGIC: Oriented to person, place, and time. Nonfocal. Normal muscle tone.  ?PSYCHIATRIC: Normal mood and affect. Normal behavior. Cooperative ? ?RADIOLOGY: ?Chest x-ray [facility] at urgent care  08/25/2020: ?Heart normal size. ?Moderate to marked ectasia of the thoracic aorta.  ?Thoracolumbar levoscoliosis.  ?No active disease ? ?CTA Chest/ ABD/ Pelvis 09/2020: ?Vascular: ?1. Negative for an aortic aneurysm or dissection. Normal caliber of the thoracic and abdominal aorta. ?2. Distal abdominal aorta and left common iliac artery are tortuous. ?3. Aortic Atherosclerosis (ICD10-I70.0). ? ?Nonvascular: ?1. Innumerable tiny nodular densities scattered throughout both lungs. Largest nodules measure 3 mm. No comparison chest CT to evaluate the stability of this finding. Suspect these tiny nodules are infectious or inflammatory but indeterminate. Slightly prominent mediastinal soft tissue, particularly near the AP window and this could represent mild lymphadenopathy. Recommend follow-up chest CT with IV contrast (standard CT) in 3 months to evaluate the mediastinum and follow-up these tiny nodules to exclude a neoplastic process. ?2. Three foci of presumed enhancement in the liver. These small enhancing lesions are indeterminate but differential diagnosis includes flash filling hemangiomas. Recommend more definitive evaluation with liver MRI, with and without contrast. ?3. Cecum is located in the left upper abdomen and findings are suggestive for a mobile cecum. No acute bowel abnormalities. ?4. Large duodenal diverticulum. Main pancreatic duct is mildly dilated and the duodenum diverticulum may be contributing to  this finding. ?5. Degenerative changes in the lumbar spine with narrowing of the central canal at L4-L5. ? ?CARDIAC DATABASE: ?EKG: ?02/10/2022: Normal sinus rhythm, 94 bpm, nonspecific T wave abnormality. ? ?Echocardiogram: ?09/23/2020: ?Normal LV systolic function with visual EF 60-65%. Left ventricle cavity is normal in size. Normal left ventricular wall thickness. Normal global wall motion. Normal diastolic filling pattern, normal LAP.  ?Mild (Grade I) aortic regurgitation. ?Mild (Grade I) mitral  regurgitation. ?Moderate tricuspid regurgitation. No evidence of pulmonary hypertension. RVSP measures 32 mmHg. ?Prior study dated 05/03/2017: LVEF 60-65%, G1DD, Mild MR, Moderate TR, PASP 16mHG.  ? ?Stress Testing: ?Lexi

## 2022-03-11 ENCOUNTER — Ambulatory Visit (INDEPENDENT_AMBULATORY_CARE_PROVIDER_SITE_OTHER): Payer: Medicare Other | Admitting: Podiatry

## 2022-03-11 ENCOUNTER — Encounter: Payer: Self-pay | Admitting: Podiatry

## 2022-03-11 DIAGNOSIS — M2041 Other hammer toe(s) (acquired), right foot: Secondary | ICD-10-CM

## 2022-03-11 DIAGNOSIS — I2584 Coronary atherosclerosis due to calcified coronary lesion: Secondary | ICD-10-CM | POA: Diagnosis not present

## 2022-03-11 DIAGNOSIS — D169 Benign neoplasm of bone and articular cartilage, unspecified: Secondary | ICD-10-CM | POA: Diagnosis not present

## 2022-03-11 DIAGNOSIS — L84 Corns and callosities: Secondary | ICD-10-CM

## 2022-03-11 DIAGNOSIS — I2510 Atherosclerotic heart disease of native coronary artery without angina pectoris: Secondary | ICD-10-CM

## 2022-03-11 NOTE — Progress Notes (Signed)
Subjective:   Patient ID: Emily Murray, female   DOB: 71 y.o.   MRN: [id number]   HPI Patient presents with significant hammertoe deformity fourth digit right and presents with lesion on the left big toe and states that are both sore and wondering what might be able to be done permanently for this.  Patient in reasonably good health   ROS      Objective:  Physical Exam  Neurovascular status intact with an elevated fourth digit right with pressure against the proximal phalanx with also distal medial keratotic lesion hallux left painful when pressed     Assessment:  Digital deformity right consistent with hammertoe deformity and keratotic lesion and keratotic lesion left     Plan:  H&P reviewed both conditions and discussed arthroplasty for the right exostectomy left and at this point we are just going to go ahead and I did debride courtesy and this can be done as needed but I did educate her on arthroplasty and access to

## 2022-03-24 ENCOUNTER — Other Ambulatory Visit: Payer: Self-pay

## 2022-08-13 ENCOUNTER — Ambulatory Visit: Payer: Medicare Other | Admitting: Cardiology

## 2022-09-14 ENCOUNTER — Ambulatory Visit: Payer: Medicare Other | Admitting: Cardiology

## 2022-09-14 ENCOUNTER — Encounter: Payer: Self-pay | Admitting: Cardiology

## 2022-09-14 VITALS — BP 140/83 | HR 70 | Resp 16 | Ht 63.0 in | Wt 136.4 lb

## 2022-09-14 DIAGNOSIS — I2510 Atherosclerotic heart disease of native coronary artery without angina pectoris: Secondary | ICD-10-CM

## 2022-09-14 DIAGNOSIS — I700 Atherosclerosis of aorta: Secondary | ICD-10-CM

## 2022-09-14 DIAGNOSIS — I10 Essential (primary) hypertension: Secondary | ICD-10-CM

## 2022-09-14 NOTE — Progress Notes (Signed)
ID:  Nechuma, Boven 1951-05-13, MRN [id number]  PCP:  Vincente Liberty, MD  Cardiologist:  Rex Kras, DO, [facility] (established care 09/11/2020) Former Cardiology Providers: Dixie Dials   Date: 09/14/22 Last Office Visit: 02/10/2022   Chief Complaint  Patient presents with   Post-op Follow-up   Follow-up    6 month     HPI  Emily Murray is a 71 y.o. female whose past medical history and cardiovascular risk factors include:  nonobstructive CAD, aortic atherosclerosis, hypertension, hypothyroidism, depression, postmenopausal female, advanced age.  Referred to the practice for evaluation and ectatic thoracic aorta.  She underwent CTA chest abdomen pelvis dissection protocol in January 2022 which reported no evidence of aortic aneurysm or dissection.  Normal caliber of the thoracic and abdominal aorta.  Distal abdominal aorta and left common iliac arteries are tortuous and aortic atherosclerosis. She has reviewed the noncardiac findings and head CT with PCP in the past.  Who presents today for 85-monthfollow-up visit.  She is doing well from a cardiovascular standpoint.  No surgical complications from her recent knee replacement with the exception of pain control.  She states that she has no limitations when it comes to physical activity but chooses not to participate in structured exercise program or daily routine.  Home blood pressures are well-controlled with SBP 130 mmHg.  Blood pressures are elevated at today's visit likely due to her running late to the office visit.   FUNCTIONAL STATUS: Stationary bike 3-5x per week for 20 minutes, gardening.  ALLERGIES: Allergies  Allergen Reactions   Azo Cranberry [Cranberry Extract]     Caused numbness in lip   Cranberry Other (See Comments)    Patient took Cranberry tablets and her lips "went numb"    MEDICATION LIST PRIOR TO VISIT: Current Meds  Medication Sig   aspirin 81 MG EC tablet Take 1 tablet (81 mg  total) by mouth daily.   atorvastatin (LIPITOR) 20 MG tablet Take 1 tablet (20 mg total) by mouth at bedtime.   clonazePAM (KLONOPIN) 0.5 MG tablet Take 0.5 mg by mouth daily as needed for anxiety. For aneixty   estrogens, conjugated, (PREMARIN) 0.625 MG tablet Take 5 mg by mouth daily. Take daily for 21 days then do not take for 7 days.   eszopiclone (LUNESTA) 2 MG TABS tablet Take 2 mg by mouth at bedtime as needed.   folic acid (FOLVITE) 1 MG tablet Take 1 mg by mouth daily.   hydrochlorothiazide (HYDRODIURIL) 25 MG tablet TAKE 1 TABLET BY MOUTH EVERY DAY IN THE MORNING   lamoTRIgine (LAMICTAL) 100 MG tablet Take 100 mg by mouth every morning.   levothyroxine (SYNTHROID, LEVOTHROID) 150 MCG tablet Take 0.75 mcg by mouth daily before breakfast.    lurasidone (LATUDA) 20 MG TABS tablet Take 20 mg by mouth daily.   sertraline (ZOLOFT) 100 MG tablet Take 200 mg by mouth daily after breakfast.   Vitamin D, Ergocalciferol, (DRISDOL) 50000 UNITS CAPS capsule Take 50,000 Units by mouth every 7 (seven) days.     PAST MEDICAL HISTORY: Past Medical History:  Diagnosis Date   Anxiety    Bipolar 1 disorder ([place]    Depressed    Hypertension    Nonobstructive atherosclerosis of coronary artery     PAST SURGICAL HISTORY: Past Surgical History:  Procedure Laterality Date   ABDOMINAL HYSTERECTOMY     LEFT HEART CATH AND CORONARY ANGIOGRAPHY N/A 05/05/2017   Procedure: LEFT HEART CATH AND CORONARY ANGIOGRAPHY;  Surgeon: KDoylene Canard  Vicenta Aly, MD;  Location: [facility];  Service: Cardiovascular;  Laterality: N/A;   rotator cuff repair Right 2015    FAMILY HISTORY: The patient family history includes Arrhythmia in her son; Diabetes in her son; Heart disease in her mother; Hypertension in her mother; Prostate cancer in her father.  SOCIAL HISTORY:  The patient  reports that she quit smoking about 29 years ago. Her smoking use included cigarettes. She has a 12.50 pack-year smoking history. She has  never used smokeless tobacco. She reports current alcohol use. She reports that she does not use drugs.  Review of Systems  Cardiovascular:  Negative for chest pain, dyspnea on exertion, leg swelling, near-syncope, orthopnea, palpitations, paroxysmal nocturnal dyspnea and syncope.  Respiratory:  Negative for shortness of breath.   Hematologic/Lymphatic: Negative for bleeding problem.  Musculoskeletal:  Positive for arthritis (knee pain) and joint pain.    PHYSICAL EXAM:    09/14/2022   11:16 AM 02/10/2022   10:59 AM 02/10/2022   10:58 AM  Vitals with BMI  Height _0   _1   Weight 136 lbs 6 oz  141 lbs  BMI 82.95  62.13  Systolic 086 578 469  Diastolic 83 99 629  Pulse 70 93 106    Physical Exam  Constitutional: No distress.  Age appropriate, hemodynamically stable.   Neck: No JVD present.  Cardiovascular: Normal rate, regular rhythm, S1 normal, S2 normal, intact distal pulses and normal pulses. Exam reveals no gallop, no S3 and no S4.  No murmur heard. Pulmonary/Chest: Effort normal and breath sounds normal. No stridor. She has no wheezes. She has no rales.  Abdominal: Soft. Bowel sounds are normal. She exhibits no distension. There is no abdominal tenderness.  Musculoskeletal:        General: No edema.     Cervical back: Neck supple.  Neurological: She is alert and oriented to person, place, and time. She has intact cranial nerves (2-12).  Skin: Skin is warm and moist.   RADIOLOGY: Chest x-ray [facility] at urgent care 08/25/2020: Heart normal size. Moderate to marked ectasia of the thoracic aorta.  Thoracolumbar levoscoliosis.  No active disease  CTA Chest/ ABD/ Pelvis 09/2020: Vascular: 1. Negative for an aortic aneurysm or dissection. Normal caliber of the thoracic and abdominal aorta. 2. Distal abdominal aorta and left common iliac artery are tortuous. 3. Aortic Atherosclerosis (ICD10-I70.0).  Nonvascular: 1. Innumerable tiny nodular densities scattered  throughout both lungs. Largest nodules measure 3 mm. No comparison chest CT to evaluate the stability of this finding. Suspect these tiny nodules are infectious or inflammatory but indeterminate. Slightly prominent mediastinal soft tissue, particularly near the AP window and this could represent mild lymphadenopathy. Recommend follow-up chest CT with IV contrast (standard CT) in 3 months to evaluate the mediastinum and follow-up these tiny nodules to exclude a neoplastic process. 2. Three foci of presumed enhancement in the liver. These small enhancing lesions are indeterminate but differential diagnosis includes flash filling hemangiomas. Recommend more definitive evaluation with liver MRI, with and without contrast. 3. Cecum is located in the left upper abdomen and findings are suggestive for a mobile cecum. No acute bowel abnormalities. 4. Large duodenal diverticulum. Main pancreatic duct is mildly dilated and the duodenum diverticulum may be contributing to this finding. 5. Degenerative changes in the lumbar spine with narrowing of the central canal at L4-L5.  CARDIAC DATABASE: EKG: 09/14/2022: Normal sinus rhythm, 61 bpm, without underlying ischemia or injury pattern  Echocardiogram: 09/23/2020: Normal LV systolic function  with visual EF 60-65%. Left ventricle cavity is normal in size. Normal left ventricular wall thickness. Normal global wall motion. Normal diastolic filling pattern, normal LAP.  Mild (Grade I) aortic regurgitation. Mild (Grade I) mitral regurgitation. Moderate tricuspid regurgitation. No evidence of pulmonary hypertension. RVSP measures 32 mmHg. Prior study dated 05/03/2017: LVEF 60-65%, G1DD, Mild MR, Moderate TR, PASP 80mHG.   Stress Testing: Lexiscan (Walking with mJaci Carrel Tetrofosmin stress test 07/22/2021: 1 Day Rest/Stress Protocol. Stress EKG is non-diagnostic, as this is pharmacological stress test using Lexiscan. Hypertensive response to exercise No obvious  evidence of reversible myocardial ischemia or prior infarct. Left ventricular cavity dilated and calculated LVEF 50% (visually appears preserved).  No prior studies available for comparison.  Clinical correlation is required.   Heart Catheterization: 05/05/2017:  Prox LAD to Mid LAD lesion, 20 %stenosed. Ost Cx to Prox Cx lesion, 15 %stenosed. Prox RCA to Mid RCA lesion, 20 %stenosed.   Medical treatment with life-style modification.  LABORATORY DATA:    Latest Ref Rng & Units 05/05/2017    3:45 AM 05/03/2017    3:23 AM 05/02/2017    7:16 PM  CBC  WBC 4.0 - 10.5 K/uL 5.6  8.6    Hemoglobin 12.0 - 15.0 g/dL 12.6  12.2  14.3   Hematocrit 36.0 - 46.0 % 37.9  36.5  42.0   Platelets 150 - 400 K/uL 263  229         Latest Ref Rng & Units 02/05/2021   11:29 AM 01/13/2021    9:04 AM 12/09/2020    3:35 PM  CMP  Glucose 65 - 99 mg/dL 85  90  86   BUN 8 - 27 mg/dL _0 Creatinine 0.57 - 1.00 mg/dL 0.88  0.91  0.87   Sodium 134 - 144 mmol/L 141  140  140   Potassium 3.5 - 5.2 mmol/L 4.9  5.3  5.3   Chloride 96 - 106 mmol/L 103  105  104   CO2 20 - 29 mmol/L _1 Calcium 8.7 - 10.3 mg/dL 9.9  9.7  9.9     Lipid Panel     Component Value Date/Time   CHOL 179 11/20/2020 1154   TRIG 70 11/20/2020 1154   HDL 88 11/20/2020 1154   CHOLHDL 2.3 08/04/2007 0450   VLDL 6 08/04/2007 0450   LDLCALC 78 11/20/2020 1154   LDLDIRECT 62 11/25/2020 1634   LABVLDL 13 11/20/2020 1154    No components found for: "NTPROBNP" No results for input(s): "PROBNP" in the last 8760 hours. No results for input(s): "TSH" in the last 8760 hours.  BMP No results for input(s): "NA", "K", "CL", "CO2", "GLUCOSE", "BUN", "CREATININE", "CALCIUM", "GFRNONAA", "GFRAA" in the last 8760 hours.   HEMOGLOBIN A1C No results found for: "HGBA1C", "MPG"   [facility]: [facility] 14.4 g/dL, hematocrit 43% Creatinine 0.91 mg/dL. eGFR: 74 mL/min per 1.73  m AST 19, ALT 12, alkaline phosphatase 80  IMPRESSION:    ICD-10-CM   1. Calcification of native coronary artery  I25.10    I25.84     2. Atherosclerosis of aorta (HCC)  I70.0     3. Nonobstructive atherosclerosis of coronary artery  I25.10     4. Benign hypertension  I10        RECOMMENDATIONS: VPHYLISS HULICKis a 71y.o. female whose past medical history and cardiac risk factors include: nonobstructive CAD, aortic  atherosclerosis, hypertension, hypothyroidism, depression, postmenopausal female, advanced age.  Calcification of native coronary artery / Atherosclerosis of aorta (HCC) No chest pain.  EKG:  nonischemic. Continue aspirin and statin therapy. Most recent echo and stress test results reviewed.  No additional testing warranted at this time.  Nonobstructive atherosclerosis of coronary artery Asymptomatic. Continue secondary prevention. Educated her on the importance of glycemic control, lipid management, and blood pressure monitoring. She is encouraged to increase physical activity with a goal of 30 minutes a day 5 days a week as tolerated.  Benign hypertension Office blood pressures are within acceptable range but not at goal. Home blood pressures are better controlled per patient. I have asked her to keep a log of her blood pressures and to either review it with myself or PCP to see if further medication titration is warranted. Low-salt diet requested/recommended.   From a cardiovascular standpoint she is doing well.  I am not actively managing any of her comorbidities and therefore once a year follow-up requested per patient.  She has regularly well visit in January and I will see her thereafter.   FINAL MEDICATION LIST END OF ENCOUNTER: No orders of the defined types were placed in this encounter.    Current Outpatient Medications:    aspirin 81 MG EC tablet, Take 1 tablet (81 mg total) by mouth daily., Disp: 30 tablet, Rfl: 2   atorvastatin (LIPITOR)  20 MG tablet, Take 1 tablet (20 mg total) by mouth at bedtime., Disp: 90 tablet, Rfl: 3   clonazePAM (KLONOPIN) 0.5 MG tablet, Take 0.5 mg by mouth daily as needed for anxiety. For aneixty, Disp: , Rfl:    estrogens, conjugated, (PREMARIN) 0.625 MG tablet, Take 5 mg by mouth daily. Take daily for 21 days then do not take for 7 days., Disp: , Rfl:    eszopiclone (LUNESTA) 2 MG TABS tablet, Take 2 mg by mouth at bedtime as needed., Disp: , Rfl:    folic acid (FOLVITE) 1 MG tablet, Take 1 mg by mouth daily., Disp: , Rfl:    hydrochlorothiazide (HYDRODIURIL) 25 MG tablet, TAKE 1 TABLET BY MOUTH EVERY DAY IN THE MORNING, Disp: 90 tablet, Rfl: 0   lamoTRIgine (LAMICTAL) 100 MG tablet, Take 100 mg by mouth every morning., Disp: , Rfl:    levothyroxine (SYNTHROID, LEVOTHROID) 150 MCG tablet, Take 0.75 mcg by mouth daily before breakfast. , Disp: , Rfl:    lurasidone (LATUDA) 20 MG TABS tablet, Take 20 mg by mouth daily., Disp: , Rfl:    sertraline (ZOLOFT) 100 MG tablet, Take 200 mg by mouth daily after breakfast., Disp: , Rfl:    Vitamin D, Ergocalciferol, (DRISDOL) 50000 UNITS CAPS capsule, Take 50,000 Units by mouth every 7 (seven) days., Disp: , Rfl:   No orders of the defined types were placed in this encounter.   There are no Patient Instructions on file for this visit.   --Continue cardiac medications as reconciled in final medication list. --Return in about 13 months (around 10/17/2023) for Annual follow up visit. Or sooner if needed. --Continue follow-up with your primary care physician regarding the management of your other chronic comorbid conditions.  Patient's questions and concerns were addressed to her satisfaction. She voices understanding of the instructions provided during this encounter.   This note was created using a voice recognition software as a result there may be grammatical errors inadvertently enclosed that do not reflect the nature of this encounter. Every attempt is made  to correct such errors.  Emily Murray  [facility]  Pager: [phone] Office: [phone]

## 2022-10-01 NOTE — Progress Notes (Signed)
Cancel appointment, per patient.  No charge  My signature

## 2022-10-04 ENCOUNTER — Other Ambulatory Visit: Payer: Self-pay

## 2022-10-04 DIAGNOSIS — I2510 Atherosclerotic heart disease of native coronary artery without angina pectoris: Secondary | ICD-10-CM

## 2022-10-04 MED ORDER — ATORVASTATIN CALCIUM 20 MG PO TABS: 20.0000 mg | ORAL_TABLET | Freq: Every day | ORAL | 3 refills | Status: AC

## 2022-10-06 ENCOUNTER — Other Ambulatory Visit: Payer: Self-pay

## 2023-01-05 ENCOUNTER — Encounter (HOSPITAL_BASED_OUTPATIENT_CLINIC_OR_DEPARTMENT_OTHER): Payer: Self-pay

## 2023-01-05 ENCOUNTER — Emergency Department (HOSPITAL_COMMUNITY)
Admission: EM | Admit: 2023-01-05 | Discharge: 2023-01-05 | Discharge status: Against Medical Advice | Payer: Medicare Other | Attending: Student | Admitting: Student

## 2023-01-05 ENCOUNTER — Emergency Department (HOSPITAL_COMMUNITY): Payer: Medicare Other

## 2023-01-05 ENCOUNTER — Other Ambulatory Visit: Payer: Self-pay

## 2023-01-05 ENCOUNTER — Emergency Department (HOSPITAL_BASED_OUTPATIENT_CLINIC_OR_DEPARTMENT_OTHER)
Admission: EM | Admit: 2023-01-05 | Discharge: 2023-01-05 | Disposition: A | Discharge status: Home / Self Care | Payer: Medicare Other | Source: Home / Self Care | Attending: Emergency Medicine | Admitting: Emergency Medicine

## 2023-01-05 ENCOUNTER — Encounter (HOSPITAL_COMMUNITY): Payer: Self-pay

## 2023-01-05 DIAGNOSIS — I700 Atherosclerosis of aorta: Secondary | ICD-10-CM | POA: Insufficient documentation

## 2023-01-05 DIAGNOSIS — B349 Viral infection, unspecified: Secondary | ICD-10-CM | POA: Insufficient documentation

## 2023-01-05 DIAGNOSIS — Z79899 Other long term (current) drug therapy: Secondary | ICD-10-CM | POA: Insufficient documentation

## 2023-01-05 DIAGNOSIS — R0602 Shortness of breath: Secondary | ICD-10-CM | POA: Diagnosis not present

## 2023-01-05 DIAGNOSIS — I10 Essential (primary) hypertension: Secondary | ICD-10-CM | POA: Insufficient documentation

## 2023-01-05 DIAGNOSIS — R0789 Other chest pain: Secondary | ICD-10-CM | POA: Insufficient documentation

## 2023-01-05 DIAGNOSIS — Z7982 Long term (current) use of aspirin: Secondary | ICD-10-CM | POA: Insufficient documentation

## 2023-01-05 DIAGNOSIS — Z1152 Encounter for screening for COVID-19: Secondary | ICD-10-CM | POA: Insufficient documentation

## 2023-01-05 DIAGNOSIS — Z5321 Procedure and treatment not carried out due to patient leaving prior to being seen by health care provider: Secondary | ICD-10-CM | POA: Diagnosis not present

## 2023-01-05 DIAGNOSIS — I2510 Atherosclerotic heart disease of native coronary artery without angina pectoris: Secondary | ICD-10-CM | POA: Insufficient documentation

## 2023-01-05 LAB — BASIC METABOLIC PANEL
Anion gap: 11 (ref 5–15)
Anion gap: 7 (ref 5–15)
BUN: 14 mg/dL (ref 8–23)
BUN: 16 mg/dL (ref 8–23)
CO2: 25 mmol/L (ref 22–32)
CO2: 25 mmol/L (ref 22–32)
Calcium: 9.2 mg/dL (ref 8.9–10.3)
Calcium: 9.1 mg/dL (ref 8.9–10.3)
Chloride: 103 mmol/L (ref 98–111)
Chloride: 109 mmol/L (ref 98–111)
Creatinine, Ser: 1.06 mg/dL — ABNORMAL HIGH (ref 0.44–1.00)
Creatinine, Ser: 0.8 mg/dL (ref 0.44–1.00)
GFR, Estimated: 55 mL/min — ABNORMAL LOW (ref >60)
GFR, Estimated: >60 mL/min (ref >60)
Glucose, Bld: 83 mg/dL (ref 70–99)
Glucose, Bld: 77 mg/dL (ref 70–99)
Potassium: 3.5 mmol/L (ref 3.5–5.1)
Potassium: 3.9 mmol/L (ref 3.5–5.1)
Sodium: 139 mmol/L (ref 135–145)
Sodium: 141 mmol/L (ref 135–145)

## 2023-01-05 LAB — CBC WITH DIFFERENTIAL/PLATELET
Abs Immature Granulocytes: 0.05 10*3/uL (ref 0.00–0.07)
Basophils Absolute: 0 10*3/uL (ref 0.0–0.1)
Basophils Relative: 0 %
Eosinophils Absolute: 0.3 10*3/uL (ref 0.0–0.5)
Eosinophils Relative: 3 %
HCT: 42.8 % (ref 36.0–46.0)
Hemoglobin: 14.1 g/dL (ref 12.0–15.0)
Immature Granulocytes: 1 %
Lymphocytes Relative: 10 %
Lymphs Abs: 0.9 10*3/uL (ref 0.7–4.0)
MCH: 26.2 pg (ref 26.0–34.0)
MCHC: 32.9 g/dL (ref 30.0–36.0)
MCV: 79.6 fL — ABNORMAL LOW (ref 80.0–100.0)
Monocytes Absolute: 0.6 10*3/uL (ref 0.1–1.0)
Monocytes Relative: 7 %
Neutro Abs: 7.1 10*3/uL (ref 1.7–7.7)
Neutrophils Relative %: 79 %
Platelets: 208 10*3/uL (ref 150–400)
RBC: 5.38 MIL/uL — ABNORMAL HIGH (ref 3.87–5.11)
RDW: 17 % — ABNORMAL HIGH (ref 11.5–15.5)
WBC: 9.1 10*3/uL (ref 4.0–10.5)
nRBC: 0 % (ref 0.0–0.2)

## 2023-01-05 LAB — RESP PANEL BY RT-PCR (RSV, FLU A&B, COVID)  RVPGX2
Influenza A by PCR: NEGATIVE
Influenza B by PCR: NEGATIVE
Resp Syncytial Virus by PCR: NEGATIVE
SARS Coronavirus 2 by RT PCR: NEGATIVE

## 2023-01-05 LAB — TROPONIN I (HIGH SENSITIVITY)
Troponin I (High Sensitivity): 15 ng/L (ref <18)
Troponin I (High Sensitivity): 12 ng/L (ref <18)

## 2023-01-05 LAB — BRAIN NATRIURETIC PEPTIDE: B Natriuretic Peptide: 138.5 pg/mL — ABNORMAL HIGH (ref 0.0–100.0)

## 2023-01-05 MED ORDER — OXYMETAZOLINE HCL 0.05 % NA SOLN
1.0000 | Freq: Once | NASAL | Status: AC
  Administered 2023-01-05: 1 via NASAL
  Filled 2023-01-05: qty 30

## 2023-01-05 MED ORDER — LACTATED RINGERS IV BOLUS
1000.0000 mL | Freq: Once | INTRAVENOUS | Status: AC
  Administered 2023-01-05: 1000 mL via INTRAVENOUS

## 2023-01-05 MED ORDER — DEXAMETHASONE SODIUM PHOSPHATE 10 MG/ML IJ SOLN
10.0000 mg | Freq: Once | INTRAMUSCULAR | Status: AC
  Administered 2023-01-05: 10 mg via INTRAVENOUS
  Filled 2023-01-05: qty 1

## 2023-01-05 MED ORDER — KETOROLAC TROMETHAMINE 15 MG/ML IJ SOLN
15.0000 mg | Freq: Once | INTRAMUSCULAR | Status: AC
  Administered 2023-01-05: 15 mg via INTRAVENOUS
  Filled 2023-01-05: qty 1

## 2023-01-05 NOTE — ED Notes (Signed)
Pt decided to go to another facility, stated she's been waiting too long and can't wait any longer.

## 2023-01-05 NOTE — ED Triage Notes (Signed)
Patient here POV from another Hospital  Endorses Congestion, Cough, Aches, SOB since Yesterday. Seen at an UC and recommended ED Evaluation. Went to another Hospital and was evaluated but LWBS due to Wait. Had some Specimens collected there.  Cough is Mostly Dry. No Fever. No Sore Throat.   NAD noted during Triage. A&OX4. GCS 15. Ambulatory.

## 2023-01-05 NOTE — ED Provider Triage Note (Signed)
Emergency Medicine Provider Triage Evaluation Note  Emily Murray , a 72 y.o. female  was evaluated in triage.  Pt complains of chest pain.  Started feeling bad yesterday, has a burning sensation in chest which is constant.  Seen urgent care, sent to ED for further evaluation.  Does feel short of breath, has been coughing somewhat..  Review of Systems  Per HPI  Physical Exam  There were no vitals taken for this visit. Gen:   Awake, no distress   Resp:  Normal effort  MSK:   Moves extremities without difficulty  Other:  Up in the extremity pulses are symmetric, is able to speak complete sentences.  Medical Decision Making  Medically screening exam initiated at 1:34 PM.  Appropriate orders placed.  [name] was informed that the remainder of the evaluation will be completed by another provider, this initial triage assessment does not replace that evaluation, and the importance of remaining in the ED until their evaluation is complete.     Theron Arista, PA-C 01/05/23 1336

## 2023-01-05 NOTE — ED Provider Notes (Signed)
[place] EMERGENCY DEPARTMENT AT [facility] Provider Note   CSN: [id number] Arrival date & time: 01/05/23  1646     History  Chief Complaint  Patient presents with   Cough    Emily Murray is a 72 y.o. female.  With PMH of CAD, HTN, bipolar disorder who presents with cough, congestion and bodyaches since yesterday.   Cough      Home Medications Prior to Admission medications   Medication Sig Start Date End Date Taking? Authorizing Provider  aspirin 81 MG EC tablet Take 1 tablet (81 mg total) by mouth daily. 09/11/20   Tolia, Sunit, DO  atorvastatin (LIPITOR) 20 MG tablet Take 1 tablet (20 mg total) by mouth at bedtime. 10/04/22   Tolia, Sunit, DO  clonazePAM (KLONOPIN) 0.5 MG tablet Take 0.5 mg by mouth daily as needed for anxiety. For aneixty    [provider]  estrogens, conjugated, (PREMARIN) 0.625 MG tablet Take 5 mg by mouth daily. Take daily for 21 days then do not take for 7 days.    [provider]  eszopiclone (LUNESTA) 2 MG TABS tablet Take 2 mg by mouth at bedtime as needed.    [provider]  folic acid (FOLVITE) 1 MG tablet Take 1 mg by mouth daily.    [provider]  hydrochlorothiazide (HYDRODIURIL) 25 MG tablet TAKE 1 TABLET BY MOUTH EVERY DAY IN THE MORNING 05/28/21   Tolia, Sunit, DO  lamoTRIgine (LAMICTAL) 100 MG tablet Take 100 mg by mouth every morning.    [provider]  levothyroxine (SYNTHROID, LEVOTHROID) 150 MCG tablet Take 0.75 mcg by mouth daily before breakfast.     [provider]  lurasidone (LATUDA) 20 MG TABS tablet Take 20 mg by mouth daily.    [provider]  sertraline (ZOLOFT) 100 MG tablet Take 200 mg by mouth daily after breakfast.    [provider]  Vitamin D, Ergocalciferol, (DRISDOL) 50000 UNITS CAPS capsule Take 50,000 Units by mouth every 7 (seven) days.    [provider]      Allergies    Azo cranberry Tilman Neat extract] and  Cranberry    Review of Systems   Review of Systems  Respiratory:  Positive for cough.     Physical Exam Updated Vital Signs BP 134/83 (BP Location: Right Arm)   Pulse 73   Temp 97.7 F (36.5 C)   Resp (!) 22   Ht 5\' 3"  (1.6 m)   Wt 68 kg   SpO2 99%   BMI 26.57 kg/m  Physical Exam  ED Results / Procedures / Treatments   Labs (all labs ordered are listed, but only abnormal results are displayed) Labs Reviewed  RESP PANEL BY RT-PCR (RSV, FLU A&B, COVID)  RVPGX2    EKG None  Radiology DG Chest 2 View  Result Date: 01/05/2023 CLINICAL DATA:  Chest congestion in burning.  Shortness of breath. EXAM: CHEST - 2 VIEW COMPARISON:  08/23/2011 FINDINGS: Heart size is normal. There is atherosclerosis and tortuosity of the thoracic aorta. The lungs are clear. No infiltrate, collapse or effusion. No acute bone finding. Mild chronic spondylosis and scoliosis. IMPRESSION: No active cardiopulmonary disease. Aortic atherosclerosis and tortuosity. Electronically Signed   By: Paulina Fusi M.D.   On: 01/05/2023 14:20    Procedures Procedures    Medications Ordered in ED Medications - No data to display  ED Course/ Medical Decision Making/ A&P  Medical Decision Making     Final Clinical Impression(s) / ED Diagnoses Final diagnoses:  None    Rx / DC Orders ED Discharge Orders     None

## 2023-01-05 NOTE — ED Triage Notes (Signed)
Pt came in via POV d/t yesterday afternoon feeling chest congestion/burning, Has & body aches. Rates pain 6/10, A/Ox4, denies fevers or SOB.

## 2023-01-05 NOTE — Discharge Instructions (Addendum)
You have been seen in the Emergency Department (ED)  today for cough, aches and congestion.  Your workup today is most consistent with an upper respiratory infection that is likely viral in nature.  Please drink plenty of fluids.  You may use Tylenol or Motrin, as written on the box, as needed for fever or discomfort.  Return to the Emergency Department (ED)  if you have worsening trouble breathing, chest pain, difficulty swallowing, neck pain or stiffness or other symptoms that concern you.  Please make an appointment to follow up with your primary care doctor within one week to assure improvement or resolution in symptoms.

## 2023-01-05 NOTE — ED Notes (Signed)
This nurse assisted patient to the bathroom and back to bed. Patient slightly dizzy with movement.

## 2023-08-03 ENCOUNTER — Telehealth: Payer: Self-pay | Admitting: Cardiology

## 2023-08-03 NOTE — Telephone Encounter (Signed)
Pt c/o medication issue:  1. Name of Medication: atorvastatin (LIPITOR) 20 MG tablet  hydrochlorothiazide (HYDRODIURIL) 25 MG tablet    2. How are you currently taking this medication (dosage and times per day)?   3. Are you having a reaction (difficulty breathing--STAT)?   4. What is your medication issue? Patient is requesting call back to go over medications that was prescribed to her with Dr. Odis Hollingshead. She is uncertain as to what she is suppose to be taking at this time.

## 2023-08-03 NOTE — Telephone Encounter (Signed)
Tried to call patient mailbox full

## 2023-08-22 NOTE — Telephone Encounter (Signed)
Tried to reach patient - mailbox full

## 2023-10-17 ENCOUNTER — Ambulatory Visit: Attending: Cardiology | Admitting: Cardiology

## 2023-11-24 ENCOUNTER — Ambulatory Visit (INDEPENDENT_AMBULATORY_CARE_PROVIDER_SITE_OTHER): Payer: Medicare Other | Admitting: Podiatry

## 2023-11-24 ENCOUNTER — Encounter: Payer: Self-pay | Admitting: Podiatry

## 2023-11-24 VITALS — Ht 63.0 in | Wt 150.0 lb

## 2023-11-24 DIAGNOSIS — L600 Ingrowing nail: Secondary | ICD-10-CM | POA: Diagnosis not present

## 2023-11-24 NOTE — Progress Notes (Signed)
 Subjective:   Patient ID: Emily Murray, female   DOB: 73 y.o.   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  Patient wants surgery understanding risk and signed consent form and I infiltrated the left second toe 60 mg like Marcaine mixture sterile prep done using sterile instrumentation remove the nail exposed matrix applied phenol 3 applications 30 seconds followed by alcohol lavage sterile dressing gave instructions on soaks and open toed shoes and reappoint as needed and encouraged her to wear dressing 24 hours take it off earlier if symptoms were to be discomforting and encouraged to call with questions

## 2023-11-24 NOTE — Patient Instructions (Signed)

## 2023-11-28 ENCOUNTER — Emergency Department (HOSPITAL_COMMUNITY): Admission: EM | Admit: 2023-11-28 | Source: Home / Self Care

## 2023-11-29 ENCOUNTER — Encounter (HOSPITAL_BASED_OUTPATIENT_CLINIC_OR_DEPARTMENT_OTHER): Payer: Self-pay | Admitting: Emergency Medicine

## 2023-11-29 ENCOUNTER — Emergency Department (HOSPITAL_BASED_OUTPATIENT_CLINIC_OR_DEPARTMENT_OTHER): Admitting: Radiology

## 2023-11-29 ENCOUNTER — Observation Stay (HOSPITAL_BASED_OUTPATIENT_CLINIC_OR_DEPARTMENT_OTHER)
Admission: EM | Admit: 2023-11-29 | Discharge: 2023-12-01 | Disposition: A | Discharge status: Home / Self Care | Attending: Internal Medicine | Admitting: Internal Medicine

## 2023-11-29 ENCOUNTER — Other Ambulatory Visit: Payer: Self-pay

## 2023-11-29 DIAGNOSIS — Z7982 Long term (current) use of aspirin: Secondary | ICD-10-CM | POA: Diagnosis not present

## 2023-11-29 DIAGNOSIS — M791 Myalgia, unspecified site: Secondary | ICD-10-CM | POA: Insufficient documentation

## 2023-11-29 DIAGNOSIS — R9431 Abnormal electrocardiogram [ECG] [EKG]: Secondary | ICD-10-CM | POA: Diagnosis present

## 2023-11-29 DIAGNOSIS — Z87891 Personal history of nicotine dependence: Secondary | ICD-10-CM | POA: Diagnosis not present

## 2023-11-29 DIAGNOSIS — I2510 Atherosclerotic heart disease of native coronary artery without angina pectoris: Secondary | ICD-10-CM | POA: Diagnosis not present

## 2023-11-29 DIAGNOSIS — R079 Chest pain, unspecified: Secondary | ICD-10-CM | POA: Diagnosis not present

## 2023-11-29 DIAGNOSIS — E039 Hypothyroidism, unspecified: Secondary | ICD-10-CM | POA: Diagnosis not present

## 2023-11-29 DIAGNOSIS — F319 Bipolar disorder, unspecified: Secondary | ICD-10-CM | POA: Diagnosis not present

## 2023-11-29 DIAGNOSIS — I10 Essential (primary) hypertension: Secondary | ICD-10-CM | POA: Diagnosis not present

## 2023-11-29 DIAGNOSIS — R7989 Other specified abnormal findings of blood chemistry: Secondary | ICD-10-CM | POA: Diagnosis not present

## 2023-11-29 DIAGNOSIS — Z1152 Encounter for screening for COVID-19: Secondary | ICD-10-CM | POA: Insufficient documentation

## 2023-11-29 DIAGNOSIS — R52 Pain, unspecified: Principal | ICD-10-CM | POA: Insufficient documentation

## 2023-11-29 DIAGNOSIS — Z79899 Other long term (current) drug therapy: Secondary | ICD-10-CM | POA: Diagnosis not present

## 2023-11-29 DIAGNOSIS — J069 Acute upper respiratory infection, unspecified: Secondary | ICD-10-CM | POA: Diagnosis not present

## 2023-11-29 DIAGNOSIS — F1410 Cocaine abuse, uncomplicated: Secondary | ICD-10-CM | POA: Insufficient documentation

## 2023-11-29 LAB — TROPONIN I (HIGH SENSITIVITY)
Troponin I (High Sensitivity): 27 ng/L — ABNORMAL HIGH (ref <18)
Troponin I (High Sensitivity): 27 ng/L — ABNORMAL HIGH (ref <18)

## 2023-11-29 LAB — BASIC METABOLIC PANEL
Anion gap: 8 (ref 5–15)
Anion gap: 6 (ref 5–15)
BUN: 19 mg/dL (ref 8–23)
BUN: 18 mg/dL (ref 8–23)
CO2: 23 mmol/L (ref 22–32)
CO2: 25 mmol/L (ref 22–32)
Calcium: 8.4 mg/dL — ABNORMAL LOW (ref 8.9–10.3)
Calcium: 8.2 mg/dL — ABNORMAL LOW (ref 8.9–10.3)
Chloride: 105 mmol/L (ref 98–111)
Chloride: 105 mmol/L (ref 98–111)
Creatinine, Ser: 0.78 mg/dL (ref 0.44–1.00)
Creatinine, Ser: 0.81 mg/dL (ref 0.44–1.00)
GFR, Estimated: >60 mL/min (ref >60)
GFR, Estimated: >60 mL/min (ref >60)
Glucose, Bld: 87 mg/dL (ref 70–99)
Glucose, Bld: 138 mg/dL — ABNORMAL HIGH (ref 70–99)
Potassium: 5.7 mmol/L — ABNORMAL HIGH (ref 3.5–5.1)
Potassium: 3.7 mmol/L (ref 3.5–5.1)
Sodium: 136 mmol/L (ref 135–145)
Sodium: 136 mmol/L (ref 135–145)

## 2023-11-29 LAB — CBC
HCT: 43.2 % (ref 36.0–46.0)
Hemoglobin: 14.1 g/dL (ref 12.0–15.0)
MCH: 26.3 pg (ref 26.0–34.0)
MCHC: 32.6 g/dL (ref 30.0–36.0)
MCV: 80.6 fL (ref 80.0–100.0)
Platelets: 216 10*3/uL (ref 150–400)
RBC: 5.36 MIL/uL — ABNORMAL HIGH (ref 3.87–5.11)
RDW: 16.4 % — ABNORMAL HIGH (ref 11.5–15.5)
WBC: 4.7 10*3/uL (ref 4.0–10.5)
nRBC: 0 % (ref 0.0–0.2)

## 2023-11-29 LAB — RESP PANEL BY RT-PCR (RSV, FLU A&B, COVID)  RVPGX2
Influenza A by PCR: NEGATIVE
Influenza B by PCR: NEGATIVE
Resp Syncytial Virus by PCR: NEGATIVE
SARS Coronavirus 2 by RT PCR: NEGATIVE

## 2023-11-29 MED ORDER — OXYCODONE-ACETAMINOPHEN 5-325 MG PO TABS
1.0000 | ORAL_TABLET | Freq: Four times a day (QID) | ORAL | Status: DC | PRN
  Administered 2023-11-29 – 2023-12-01 (×5): 1 via ORAL
  Filled 2023-11-29 (×5): qty 1

## 2023-11-29 MED ORDER — SODIUM CHLORIDE 0.9 % IV BOLUS
1000.0000 mL | Freq: Once | INTRAVENOUS | Status: AC
  Administered 2023-11-29: 1000 mL via INTRAVENOUS

## 2023-11-29 MED ORDER — KETOROLAC TROMETHAMINE 15 MG/ML IJ SOLN
15.0000 mg | Freq: Once | INTRAMUSCULAR | Status: AC
  Administered 2023-11-29: 15 mg via INTRAVENOUS
  Filled 2023-11-29: qty 1

## 2023-11-29 NOTE — ED Provider Triage Note (Signed)
 Emergency Medicine Provider Triage Evaluation Note  Emily Murray , a 73 y.o. female  was evaluated in triage.  Pt complains of body aches, chills, chest pain. Reports sx began last week, improved and then acutely worsened on Sunday. Denies known sick contacts. Endorsing chest pain, back pain. Denies N/V/D. Denies fevers at home. Reports went to UC yesterday and was advised to come to ED if sx worsen.  Review of Systems  Positive:  Negative:   Physical Exam  BP (!) 153/88 (BP Location: Right Arm)   Pulse 80   Temp 98 F (36.7 C) (Oral)   Resp 19   SpO2 100%  Gen:   Awake, no distress   Resp:  Normal effort  MSK:   Moves extremities without difficulty  Other:  Lung sounds clear  Medical Decision Making  Medically screening exam initiated at 1:41 PM.  Appropriate orders placed.  Emily Murray was informed that the remainder of the evaluation will be completed by another provider, this initial triage assessment does not replace that evaluation, and the importance of remaining in the ED until their evaluation is complete.     Al Decant, PA-C 11/29/23 1342

## 2023-11-29 NOTE — Progress Notes (Signed)
 Plan of Care Note for accepted transfer   Patient: Emily Murray MRN: [id number]   DOA: 11/29/2023  Facility requesting transfer: [facility] Requesting Provider: Maxwell Marion, PA-C   Reason for transfer: Chest pain, rule out ACS. Facility course:  Emily Murray is a 73 y.o. female with a history of coronary artery disease, hypertension, and bipolar disorder presents the ED today for body aches.  Patient reports body aches and tightness in the chest for the past several days.  She has been taking naproxen and increase her fluid intake without any improvement of symptoms.  Denies shortness of breath, fevers, nausea, vomiting.  When the patient came to the ER, BP was 143/98 with otherwise normal vital signs.  BMP was within normal.  CBC was within normal.  Respiratory panel came back negative.  2 view chest x-ray showed hyperinflation with left basilar scarring or atelectasis and dilated tortuous aorta. EKG showed normal sinus rhythm with a rate of 75 with right atrial enlargement and T wave inversion inferolaterally.  The patient was given Toradol 15 mg IV and 1 L bolus of IV normal saline.  Plan of care: The patient is accepted for admission to Telemetry unit, at [facility], for chest pain, rule out ACS.  The patient will be under the care and the responsibility of the EDP until arrival to [facility].  Author: Hannah Beat, MD 11/29/2023  Check [email or web address] for on-call coverage.  Nursing staff, Please call [facility] Admits & Consults System-Wide number on Amion as soon as patient's arrival, so appropriate admitting provider can evaluate the pt.

## 2023-11-29 NOTE — ED Provider Notes (Signed)
 [place] EMERGENCY DEPARTMENT AT [facility] Provider Note   CSN: [id number] Arrival date & time: 11/29/23  1329     History  Chief Complaint  Patient presents with   Generalized Body Aches    Emily Murray is a 73 y.o. female with a history of coronary artery disease, hypertension, and bipolar disorder presents the ED today for body aches.  Patient reports body aches and tightness in the chest for the past several days.  She has been taking naproxen and increase her fluid intake without any improvement of symptoms.  Denies shortness of breath, fevers, nausea, vomiting, or abdominal pain.  No known sick contact.  No additional complaints or concerns at this time.    Home Medications Prior to Admission medications   Medication Sig Start Date End Date Taking? Authorizing Provider  aspirin 81 MG EC tablet Take 1 tablet (81 mg total) by mouth daily. 09/11/20   Tolia, Sunit, DO  atorvastatin (LIPITOR) 20 MG tablet Take 1 tablet (20 mg total) by mouth at bedtime. 10/04/22   Tolia, Sunit, DO  clonazePAM (KLONOPIN) 0.5 MG tablet Take 0.5 mg by mouth daily as needed for anxiety. For aneixty    [provider]  estrogens, conjugated, (PREMARIN) 0.625 MG tablet Take 5 mg by mouth daily. Take daily for 21 days then do not take for 7 days.    [provider]  eszopiclone (LUNESTA) 2 MG TABS tablet Take 2 mg by mouth at bedtime as needed.    [provider]  folic acid (FOLVITE) 1 MG tablet Take 1 mg by mouth daily.    [provider]  hydrochlorothiazide (HYDRODIURIL) 25 MG tablet TAKE 1 TABLET BY MOUTH EVERY DAY IN THE MORNING 05/28/21   Tolia, Sunit, DO  lamoTRIgine (LAMICTAL) 100 MG tablet Take 100 mg by mouth every morning.    [provider]  levothyroxine (SYNTHROID, LEVOTHROID) 150 MCG tablet Take 0.75 mcg by mouth daily before breakfast.     [provider]  lurasidone (LATUDA) 20 MG TABS tablet Take 20 mg by mouth daily.     [provider]  sertraline (ZOLOFT) 100 MG tablet Take 200 mg by mouth daily after breakfast.    [provider]  Vitamin D, Ergocalciferol, (DRISDOL) 50000 UNITS CAPS capsule Take 50,000 Units by mouth every 7 (seven) days.    [provider]      Allergies    Azo cranberry Tilman Neat extract] and Cranberry    Review of Systems   Review of Systems  Constitutional:  Positive for fatigue.  All other systems reviewed and are negative.   Physical Exam Updated Vital Signs BP (!) 159/103   Pulse 73   Temp 98.3 F (36.8 C) (Oral)   Resp 20   SpO2 100%  Physical Exam Vitals and nursing note reviewed.  Constitutional:      General: She is not in acute distress.    Appearance: Normal appearance.  HENT:     Head: Normocephalic and atraumatic.     Mouth/Throat:     Mouth: Mucous membranes are moist.  Eyes:     Conjunctiva/sclera: Conjunctivae normal.     Pupils: Pupils are equal, round, and reactive to light.  Cardiovascular:     Rate and Rhythm: Normal rate and regular rhythm.     Pulses: Normal pulses.     Heart sounds: Normal heart sounds.  Pulmonary:     Effort: Pulmonary effort is normal.     Breath sounds: Normal  breath sounds.  Abdominal:     Palpations: Abdomen is soft.     Tenderness: There is no abdominal tenderness.  Musculoskeletal:        General: No tenderness. Normal range of motion.     Cervical back: Normal range of motion.     Right lower leg: No edema.     Left lower leg: No edema.  Skin:    General: Skin is warm and dry.     Findings: No rash.  Neurological:     General: No focal deficit present.     Mental Status: She is alert.     Sensory: No sensory deficit.     Motor: No weakness.  Psychiatric:        Mood and Affect: Mood normal.        Behavior: Behavior normal.    ED Results / Procedures / Treatments   Labs (all labs ordered are listed, but only abnormal results are displayed) Labs Reviewed  CBC - Abnormal;  Notable for the following components:      Result Value   RBC 5.36 (*)    RDW 16.4 (*)    All other components within normal limits  BASIC METABOLIC PANEL - Abnormal; Notable for the following components:   Potassium 5.7 (*)    Calcium 8.4 (*)    All other components within normal limits  BASIC METABOLIC PANEL - Abnormal; Notable for the following components:   Glucose, Bld 138 (*)    Calcium 8.2 (*)    All other components within normal limits  TROPONIN I (HIGH SENSITIVITY) - Abnormal; Notable for the following components:   Troponin I (High Sensitivity) 27 (*)    All other components within normal limits  TROPONIN I (HIGH SENSITIVITY) - Abnormal; Notable for the following components:   Troponin I (High Sensitivity) 27 (*)    All other components within normal limits  RESP PANEL BY RT-PCR (RSV, FLU A&B, COVID)  RVPGX2    EKG EKG Interpretation Date/Time:  Tuesday November 29 2023 13:42:20 EST Ventricular Rate:  75 PR Interval:  158 QRS Duration:  80 QT Interval:  382 QTC Calculation: 426 R Axis:   7  Text Interpretation: Normal sinus rhythm Right atrial enlargement Cannot rule out Anterior infarct (cited on or before 05-Jan-2023)  nonspecific T wave changes present similar to prior Confirmed by Ernie Avena ([id number]) on 11/29/2023 3:15:00 PM  Radiology DG Chest 2 View Result Date: 11/29/2023 CLINICAL DATA:  Chest pain. EXAM: CHEST - 2 VIEW COMPARISON:  X-ray 01/05/2023. CTA 10/21/2020. FINDINGS: Hyperinflation. There is some linear opacity at the left lung base likely scar or atelectasis. No consolidation, pneumothorax, effusion or edema. Normal cardiopericardial silhouette. Calcified, tortuous and ectatic aorta, unchanged from previous. Curvature of the spine with scattered degenerative changes. IMPRESSION: Hyperinflation.  Left basilar scar or atelectasis. Dilated, tortuous aorta. Electronically Signed   By: Karen Kays M.D.   On: 11/29/2023 17:08    Procedures Procedures: not  indicated.   Medications Ordered in ED Medications  oxyCODONE-acetaminophen (PERCOCET/ROXICET) 5-325 MG per tablet 1 tablet (has no administration in time range)  sodium chloride 0.9 % bolus 1,000 mL (1,000 mLs Intravenous New Bag/Given 11/29/23 1938)  ketorolac (TORADOL) 15 MG/ML injection 15 mg (15 mg Intravenous Given 11/29/23 1948)    ED Course/ Medical Decision Making/ A&P  Medical Decision Making Amount and/or Complexity of Data Reviewed Labs: ordered.  Risk Prescription drug management. Decision regarding hospitalization.   This patient presents to the ED for concern of body aches with chest tightness, this involves an extensive number of treatment options, and is a complaint that carries with it a high risk of complications and morbidity.   Differential diagnosis includes: ACS, costochondritis, pleurisy, flu, COVID, RSV, other viral illness, etc.   Comorbidities  See HPI above   Additional History  Additional history obtained from prior records   Cardiac Monitoring / EKG  The patient was maintained on a cardiac monitor.  I personally viewed and interpreted the cardiac monitored which showed: NSR with right atrial enlargement, a heart rate of 75 bpm. Heart score of 4 (moderate)   Lab Tests  I ordered and personally interpreted labs.  The pertinent results include:   Initial and delta troponin of 27 BMP and CBC within normal limits - no acute electrolyte derangement, AKI, infection, or anemia Negative respiratory panel   Imaging Studies  I ordered imaging studies including CXR  I independently visualized and interpreted imaging which showed:  No acute cardiopulmonary processes I agree with the radiologist interpretation   Consultations  I requested consultation with Dr. Arville Care with [facility],  and discussed lab and imaging findings as well as pertinent plan - they recommend: Recommendation for further observation.    Problem  List / ED Course / Critical Interventions / Medication Management  Persistent dody aches and chest tightness x3 days I ordered medications including: NS and Toradol for body aches  Reevaluation of the patient after these medicines showed that the patient stayed the same. Patient staffed with my attending, Dr. Karene Fry Discussed findings with patient. Due to mildly elevated troponin levels, persistent chest pain, and last cath being in 2018, will admit to hospital. Discussed finds with patient.  All questions were answered.  With shared decision making, patient will be admitted for further observation.   Social Determinants of Health  Tobacco use   Test / Admission - Considered  Patient is agreeable with plan for admission.       Final Clinical Impression(s) / ED Diagnoses Final diagnoses:  Chest pain, unspecified type  Elevated troponin level    Rx / DC Orders ED Discharge Orders     None         Maxwell Marion, PA-C 11/29/23 2327    Ernie Avena, MD 11/29/23 [phone]

## 2023-11-29 NOTE — ED Notes (Signed)
 Pt unable to eat frozen dinner states she was in too much pain Graham crackers and peanut butter given to pt to try to eat after pain lessens

## 2023-11-29 NOTE — ED Triage Notes (Signed)
 Flu like symptoms x 5 days Seen at [facility] given cough meds and has been taking OTC for body aches Runny nose Denies cough  Naproxen taken around noon

## 2023-11-30 DIAGNOSIS — Z7982 Long term (current) use of aspirin: Secondary | ICD-10-CM | POA: Diagnosis not present

## 2023-11-30 DIAGNOSIS — Z1152 Encounter for screening for COVID-19: Secondary | ICD-10-CM | POA: Diagnosis not present

## 2023-11-30 DIAGNOSIS — I2510 Atherosclerotic heart disease of native coronary artery without angina pectoris: Secondary | ICD-10-CM | POA: Diagnosis not present

## 2023-11-30 DIAGNOSIS — M791 Myalgia, unspecified site: Secondary | ICD-10-CM | POA: Diagnosis not present

## 2023-11-30 DIAGNOSIS — F319 Bipolar disorder, unspecified: Secondary | ICD-10-CM | POA: Diagnosis not present

## 2023-11-30 DIAGNOSIS — Z79899 Other long term (current) drug therapy: Secondary | ICD-10-CM | POA: Diagnosis not present

## 2023-11-30 DIAGNOSIS — J069 Acute upper respiratory infection, unspecified: Secondary | ICD-10-CM | POA: Diagnosis not present

## 2023-11-30 DIAGNOSIS — R079 Chest pain, unspecified: Secondary | ICD-10-CM | POA: Diagnosis not present

## 2023-11-30 DIAGNOSIS — Z87891 Personal history of nicotine dependence: Secondary | ICD-10-CM | POA: Diagnosis not present

## 2023-11-30 DIAGNOSIS — R9431 Abnormal electrocardiogram [ECG] [EKG]: Secondary | ICD-10-CM | POA: Diagnosis not present

## 2023-11-30 DIAGNOSIS — E039 Hypothyroidism, unspecified: Secondary | ICD-10-CM | POA: Diagnosis present

## 2023-11-30 DIAGNOSIS — R52 Pain, unspecified: Secondary | ICD-10-CM | POA: Diagnosis present

## 2023-11-30 DIAGNOSIS — R7989 Other specified abnormal findings of blood chemistry: Secondary | ICD-10-CM | POA: Diagnosis not present

## 2023-11-30 DIAGNOSIS — R072 Precordial pain: Secondary | ICD-10-CM | POA: Diagnosis not present

## 2023-11-30 DIAGNOSIS — I10 Essential (primary) hypertension: Secondary | ICD-10-CM | POA: Diagnosis not present

## 2023-11-30 DIAGNOSIS — F1410 Cocaine abuse, uncomplicated: Secondary | ICD-10-CM | POA: Diagnosis not present

## 2023-11-30 LAB — RAPID URINE DRUG SCREEN, HOSP PERFORMED
Amphetamines: NOT DETECTED
Barbiturates: NOT DETECTED
Benzodiazepines: NOT DETECTED
Cocaine: POSITIVE — AB
Opiates: POSITIVE — AB
Tetrahydrocannabinol: NOT DETECTED

## 2023-11-30 LAB — TROPONIN I (HIGH SENSITIVITY): Troponin I (High Sensitivity): 24 ng/L — ABNORMAL HIGH (ref <18)

## 2023-11-30 LAB — HEMOGLOBIN A1C
Hgb A1c MFr Bld: 5.6 % (ref 4.8–5.6)
Mean Plasma Glucose: 114.02 mg/dL

## 2023-11-30 LAB — HEPATIC FUNCTION PANEL
ALT: 14 U/L (ref 0–44)
AST: 20 U/L (ref 15–41)
Albumin: 2.9 g/dL — ABNORMAL LOW (ref 3.5–5.0)
Alkaline Phosphatase: 70 U/L (ref 38–126)
Bilirubin, Direct: 0.1 mg/dL (ref 0.0–0.2)
Indirect Bilirubin: 0.3 mg/dL (ref 0.3–0.9)
Total Bilirubin: 0.4 mg/dL (ref 0.0–1.2)
Total Protein: 5.5 g/dL — ABNORMAL LOW (ref 6.5–8.1)

## 2023-11-30 LAB — TSH: TSH: 5.389 u[IU]/mL — ABNORMAL HIGH (ref 0.350–4.500)

## 2023-11-30 LAB — T4, FREE: Free T4: 0.7 ng/dL (ref 0.61–1.12)

## 2023-11-30 MED ORDER — HEPARIN SODIUM (PORCINE) 5000 UNIT/ML IJ SOLN
5000.0000 [IU] | Freq: Three times a day (TID) | INTRAMUSCULAR | Status: DC
  Administered 2023-11-30 – 2023-12-01 (×3): 5000 [IU] via SUBCUTANEOUS
  Filled 2023-11-30 (×3): qty 1

## 2023-11-30 MED ORDER — HYDROCHLOROTHIAZIDE 12.5 MG PO TABS
12.5000 mg | ORAL_TABLET | Freq: Two times a day (BID) | ORAL | Status: DC
Start: 2023-11-30 — End: 2023-12-01
  Administered 2023-11-30: 12.5 mg via ORAL
  Filled 2023-11-30: qty 1

## 2023-11-30 MED ORDER — ACETAMINOPHEN 325 MG PO TABS: 650.0000 mg | ORAL_TABLET | ORAL | Status: DC | PRN

## 2023-11-30 MED ORDER — PANTOPRAZOLE SODIUM 40 MG IV SOLR
40.0000 mg | INTRAVENOUS | Status: DC
  Administered 2023-11-30: 40 mg via INTRAVENOUS
  Filled 2023-11-30: qty 10

## 2023-11-30 MED ORDER — FOLIC ACID 1 MG PO TABS
1.0000 mg | ORAL_TABLET | Freq: Once | ORAL | Status: AC
  Administered 2023-11-30: 1 mg via ORAL
  Filled 2023-11-30: qty 1

## 2023-11-30 MED ORDER — ASPIRIN 81 MG PO TBEC
81.0000 mg | DELAYED_RELEASE_TABLET | Freq: Every day | ORAL | Status: DC
  Administered 2023-12-01: 81 mg via ORAL
  Filled 2023-11-30: qty 1

## 2023-11-30 MED ORDER — CLONAZEPAM 0.5 MG PO TABS
0.5000 mg | ORAL_TABLET | Freq: Every day | ORAL | Status: DC
  Administered 2023-12-01: 0.5 mg via ORAL
  Filled 2023-11-30: qty 1

## 2023-11-30 MED ORDER — LEVOTHYROXINE SODIUM 75 MCG PO TABS
75.0000 ug | ORAL_TABLET | Freq: Every day | ORAL | Status: DC
  Administered 2023-12-01: 75 ug via ORAL
  Filled 2023-11-30: qty 1

## 2023-11-30 MED ORDER — LAMOTRIGINE 100 MG PO TABS
100.0000 mg | ORAL_TABLET | Freq: Every morning | ORAL | Status: DC
  Administered 2023-12-01: 100 mg via ORAL
  Filled 2023-11-30: qty 1

## 2023-11-30 MED ORDER — CLONAZEPAM 0.5 MG PO TABS
0.5000 mg | ORAL_TABLET | Freq: Two times a day (BID) | ORAL | Status: DC | PRN
  Administered 2023-11-30: 0.5 mg via ORAL
  Filled 2023-11-30: qty 1

## 2023-11-30 MED ORDER — HYDROCHLOROTHIAZIDE 25 MG PO TABS
25.0000 mg | ORAL_TABLET | Freq: Once | ORAL | Status: AC
  Administered 2023-11-30: 25 mg via ORAL
  Filled 2023-11-30: qty 1

## 2023-11-30 MED ORDER — MORPHINE SULFATE (PF) 2 MG/ML IV SOLN
1.0000 mg | INTRAVENOUS | Status: DC | PRN
  Administered 2023-11-30: 1 mg via INTRAVENOUS
  Filled 2023-11-30: qty 1

## 2023-11-30 MED ORDER — LAMOTRIGINE 100 MG PO TABS
100.0000 mg | ORAL_TABLET | Freq: Every day | ORAL | Status: DC
  Administered 2023-11-30: 100 mg via ORAL
  Filled 2023-11-30: qty 1

## 2023-11-30 MED ORDER — ATORVASTATIN CALCIUM 10 MG PO TABS: 20.0000 mg | ORAL_TABLET | Freq: Every day | ORAL | Status: DC

## 2023-11-30 MED ORDER — ATORVASTATIN CALCIUM 40 MG PO TABS
40.0000 mg | ORAL_TABLET | Freq: Every day | ORAL | Status: DC
Start: 2023-11-30 — End: 2023-12-01
  Administered 2023-11-30: 40 mg via ORAL
  Filled 2023-11-30: qty 1

## 2023-11-30 MED ORDER — LEVOTHYROXINE SODIUM 75 MCG PO TABS
75.0000 ug | ORAL_TABLET | Freq: Every day | ORAL | Status: DC
  Administered 2023-11-30: 75 ug via ORAL
  Filled 2023-11-30: qty 3

## 2023-11-30 MED ORDER — ASPIRIN 81 MG PO CHEW
81.0000 mg | CHEWABLE_TABLET | Freq: Once | ORAL | Status: AC
  Administered 2023-11-30: 81 mg via ORAL
  Filled 2023-11-30: qty 1

## 2023-11-30 MED ORDER — SERTRALINE HCL 100 MG PO TABS
100.0000 mg | ORAL_TABLET | Freq: Every day | ORAL | Status: DC
  Administered 2023-11-30 – 2023-12-01 (×2): 100 mg via ORAL
  Filled 2023-11-30: qty 4
  Filled 2023-11-30: qty 1

## 2023-11-30 MED ORDER — NITROGLYCERIN 0.4 MG SL SUBL: 0.4000 mg | SUBLINGUAL_TABLET | SUBLINGUAL | Status: DC | PRN

## 2023-11-30 NOTE — Assessment & Plan Note (Signed)
 Monitor mag level.

## 2023-11-30 NOTE — Assessment & Plan Note (Signed)
 Patient has a previous history of nonobstructive CAD with a heart cath in 2018 patient is followed closely by cardiology. Will continue patient on aspirin and statin therapy repeat troponin.  EKG as needed.  Cardiology consult.

## 2023-11-30 NOTE — ED Notes (Signed)
 Called Monisha at [facility] for transport

## 2023-11-30 NOTE — Assessment & Plan Note (Signed)
 Patient presenting retrosternal chest pain differentials include ischemic heart disease versus peptic ulcer disease. Will admit patient for monitoring and consult cardiology for evaluation with possible stress test. -.  Nitroglycerin as needed, morphine as needed, IV PPI. -  EKG as needed. -Continue patient's aspirin 81, atorvastatin 20. -Cardiology consult. -N.p.o. after midnight.

## 2023-11-30 NOTE — Assessment & Plan Note (Signed)
 Vitals:   11/30/23 0500 11/30/23 0600 11/30/23 0700 11/30/23 0800  BP: (!) 149/100 (!) 150/82 137/86 (!) 130/114   11/30/23 1022 11/30/23 1100 11/30/23 1200 11/30/23 1300  BP: 105/72 (!) 139/90 124/86 132/77   11/30/23 1400 11/30/23 1410 11/30/23 1500 11/30/23 1643  BP: (!) 144/76 (!) 144/76 (!) 147/88 (!) 160/76  Home regimen consists of HCTZ 25, will continue patient on 12.5.  Monitor. BB not initiated due to HR in 60-70's.

## 2023-11-30 NOTE — Plan of Care (Signed)
  Problem: Education: Goal: Knowledge of General Education information will improve Description: Including pain rating scale, medication(s)/side effects and non-pharmacologic comfort measures Outcome: Progressing   Problem: Health Behavior/Discharge Planning: Goal: Ability to manage health-related needs will improve Outcome: Progressing   Problem: Pain Managment: Goal: General experience of comfort will improve and/or be controlled Outcome: Progressing

## 2023-11-30 NOTE — Assessment & Plan Note (Signed)
 Continue patient on Zoloft 100 mg.

## 2023-11-30 NOTE — ED Notes (Signed)
 Pt escorted to restroom, standby assist. Pt ambulated w/ steady gait, denied any dizziness. Pt placed back on monitor in bed upon returning to room.

## 2023-11-30 NOTE — ED Provider Notes (Signed)
 This patient has been admitted to the hospitalist service but has remained in [facility] awaiting an open bed at another [facility] facility.  She reports improvement in her aches and is feeling little better than yesterday.  Remains hemodynamically stable.   Royanne Foots, DO 11/30/23 1452

## 2023-11-30 NOTE — H&P (Signed)
 History and Physical    Patient: Emily Murray ION:[id number] DOB: November 01, 1950 DOA: 11/29/2023 DOS: the patient was seen and examined on 11/30/2023 PCP: Corine Shelter, MD  Patient coming from: Home Chief complaint: Chief Complaint  Patient presents with   Generalized Body Aches   HPI:  Emily Murray is a 73 y.o. 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  Per documentation patient has been having upper respiratory symptoms with congestion myalgias and runny nose since past few days she was also seen at the urgent care and was given over-the-counter medications and takes naproxen.  Chart review shows that patient has a history of cocaine abuse in the past.  >>ED Course: Direct admit At bedside patient is alert awake oriented afebrile gives history and states that she is not having chest pain is rather chest pressure midsternal nonradiating but sometimes going to her upper back and felt mainly like she has the flu.  Patient states she has quit smoking since about several years and does not use any alcohol or drugs.  Patient states chest pain is resolved right now after receiving morphine.  Patient does see cardiologist Dr. Jovita Gamma who is following her for her hypertension. Vitals:   11/30/23 1400 11/30/23 1410 11/30/23 1500 11/30/23 1643  BP: (!) 144/76 (!) 144/76 (!) 147/88 (!) 160/76  Pulse:  63 65 64  Temp:  (!) 97.5 F (36.4 C)  98.7 F (37.1 C)  Resp: 18 17 (!) 22 19  Height:    5\' 3"  (1.6 m)  Weight:    66.5 kg  SpO2:  100% 100% 99%  TempSrc:  Oral  Oral  BMI (Calculated):    25.98  ED evaluation  so far shows: Initial EKG shows sinus rhythm at 75 with PR of 158 QTc 426, right atrial enlargement EKG has to be  scanned.  Troponin of 20 7 repeat troponin of 27. Initial metabolic panel showing potassium of 5.7 normal kidney function, LFTs added on and pending. CBC is within normal limits.  In the emergency room  pt has received the following treatment thus far: Medications  oxyCODONE-acetaminophen (PERCOCET/ROXICET) 5-325 MG per tablet 1 tablet (1 tablet Oral Given 11/30/23 1456)  sertraline (ZOLOFT) tablet 100 mg (100 mg Oral Given 11/30/23 0846)  levothyroxine (SYNTHROID) tablet 75 mcg (75 mcg Oral Given 11/30/23 0845)  lamoTRIgine (LAMICTAL) tablet 100 mg (100 mg Oral Given 11/30/23 0845)  clonazePAM (KLONOPIN) tablet 0.5 mg (0.5 mg Oral Not Given 11/30/23 1151)  sodium chloride 0.9 % bolus 1,000 mL (0 mLs Intravenous Stopped 11/29/23 2038)  ketorolac (TORADOL) 15 MG/ML injection 15 mg (15 mg Intravenous Given 11/29/23 1948)  aspirin chewable tablet 81 mg (81 mg Oral Given 11/30/23 0845)  hydrochlorothiazide (HYDRODIURIL) tablet 25 mg (25 mg Oral Given 11/30/23 0845)  folic acid (FOLVITE) tablet 1 mg (1 mg Oral Given 11/30/23 0845)    Review of Systems  Constitutional:  Positive for malaise/fatigue.  Cardiovascular:  Positive for chest pain.   Past Medical History:  Diagnosis Date   Anxiety    Bipolar 1 disorder (HCC)    Depressed    Hypertension    Nonobstructive atherosclerosis of coronary artery    Past Surgical History:  Procedure Laterality Date   ABDOMINAL HYSTERECTOMY  LEFT HEART CATH AND CORONARY ANGIOGRAPHY N/A 05/05/2017   Procedure: LEFT HEART CATH AND CORONARY ANGIOGRAPHY;  Surgeon: Orpah Cobb, MD;  Location: [facility];  Service: Cardiovascular;  Laterality: N/A;   rotator cuff repair Right 2015    reports that she quit smoking about 31 years ago. Her smoking use included cigarettes. She started smoking about 56 years ago. She has a 12.5 pack-year smoking history. She has never used smokeless tobacco. She reports that she does not currently use alcohol. She reports that she does  not use drugs.  Allergies  Allergen Reactions   Azo Cranberry [Cranberry Extract]     Caused numbness in lip   Cranberry Other (See Comments)    Patient took Cranberry tablets and her lips "went numb"    Family History  Problem Relation Age of Onset   Hypertension Mother    Heart disease Mother    Prostate cancer Father    Arrhythmia Son    Diabetes Son    Cancer Neg Hx    CAD Neg Hx     Prior to Admission medications   Medication Sig Start Date End Date Taking? Authorizing Provider  aspirin 81 MG EC tablet Take 1 tablet (81 mg total) by mouth daily. 09/11/20  Yes Tolia, Sunit, DO  atorvastatin (LIPITOR) 20 MG tablet Take 1 tablet (20 mg total) by mouth at bedtime. 10/04/22  Yes Tolia, Sunit, DO  brompheniramine-pseudoephedrine-DM 30-2-10 MG/5ML syrup Take 10 mLs by mouth 4 (four) times daily as needed. 11/28/23 12/08/23 Yes [provider]  clonazePAM (KLONOPIN) 0.5 MG tablet Take 0.5 mg by mouth daily as needed for anxiety. For aneixty   Yes [provider]  estrogens, conjugated, (PREMARIN) 0.625 MG tablet Take 5 mg by mouth daily. Take daily for 21 days then do not take for 7 days.   Yes [provider]  eszopiclone (LUNESTA) 2 MG TABS tablet Take 2 mg by mouth at bedtime as needed.   Yes [provider]  folic acid (FOLVITE) 1 MG tablet Take 1 mg by mouth daily.   Yes [provider]  hydrochlorothiazide (HYDRODIURIL) 25 MG tablet TAKE 1 TABLET BY MOUTH EVERY DAY IN THE MORNING 05/28/21  Yes Tolia, Sunit, DO  ibuprofen (ADVIL) 200 MG tablet Take 200 mg by mouth as needed.   Yes [provider]  levothyroxine (SYNTHROID, LEVOTHROID) 150 MCG tablet Take 0.75 mcg by mouth daily before breakfast.    Yes [provider]  lurasidone (LATUDA) 20 MG TABS tablet Take 20 mg by mouth daily.   Yes [provider]  naproxen sodium (ALEVE) 220 MG tablet Take 220 mg by mouth as needed.   Yes [provider]  sertraline  (ZOLOFT) 100 MG tablet Take 200 mg by mouth daily after breakfast.   Yes [provider]  Vitamin D, Ergocalciferol, (DRISDOL) 50000 UNITS CAPS capsule Take 50,000 Units by mouth every 7 (seven) days.   Yes [provider]  lamoTRIgine (LAMICTAL) 100 MG tablet Take 100 mg by mouth every morning.    [provider]     Vitals:   11/30/23 1400 11/30/23 1410 11/30/23 1500 11/30/23 1643  BP: (!) 144/76 (!) 144/76 (!) 147/88 (!) 160/76  Pulse:  63 65 64  Resp: 18 17 (!) 22 19  Temp:  (!) 97.5 F (36.4 C)  98.7 F (37.1 C)  TempSrc:  Oral  Oral  SpO2:  100% 100% 99%  Weight:    66.5 kg  Height:  5\' 3"  (1.6 m)   Physical Exam Vitals and nursing note reviewed.  Constitutional:      General: She is not in acute distress. HENT:     Head: Normocephalic and atraumatic.     Right Ear: Hearing normal.     Left Ear: Hearing normal.     Nose: Nose normal. No nasal deformity.     Mouth/Throat:     Lips: Pink.     Tongue: No lesions.     Pharynx: Oropharynx is clear.  Eyes:     General: Lids are normal.     Extraocular Movements: Extraocular movements intact.  Cardiovascular:     Rate and Rhythm: Normal rate and regular rhythm.     Heart sounds: Normal heart sounds.  Pulmonary:     Effort: Pulmonary effort is normal.     Breath sounds: Normal breath sounds.  Abdominal:     General: Bowel sounds are normal. There is no distension.     Palpations: Abdomen is soft. There is no mass.     Tenderness: There is no abdominal tenderness.  Musculoskeletal:     Right lower leg: No edema.     Left lower leg: No edema.  Skin:    General: Skin is warm.  Neurological:     General: No focal deficit present.     Mental Status: She is alert and oriented to person, place, and time.     Cranial Nerves: Cranial nerves 2-12 are intact.  Psychiatric:        Attention and Perception: Attention normal.        Mood and Affect: Mood normal.        Speech: Speech normal.         Behavior: Behavior normal. Behavior is cooperative.      Labs on Admission: I have personally reviewed following labs and imaging studies Results for orders placed or performed during the hospital encounter of 11/29/23 (from the past 24 hours)  Type and screen     Status: None (Preliminary result)   Collection Time: 11/30/23  7:05 PM  Result Value Ref Range   ABO/RH(D) PENDING    Antibody Screen PENDING    Sample Expiration      12/03/2023,2359 Performed at [facility], [address], [place], [place] [address]   Hepatic function panel     Status: Abnormal   Collection Time: 11/30/23  7:11 PM  Result Value Ref Range   Total Protein 5.5 (L) 6.5 - 8.1 g/dL   Albumin 2.9 (L) 3.5 - 5.0 g/dL   AST 20 15 - 41 U/L   ALT 14 0 - 44 U/L   Alkaline Phosphatase 70 38 - 126 U/L   Total Bilirubin 0.4 0.0 - 1.2 mg/dL   Bilirubin, Direct 0.1 0.0 - 0.2 mg/dL   Indirect Bilirubin 0.3 0.3 - 0.9 mg/dL  Troponin I (High Sensitivity)     Status: Abnormal   Collection Time: 11/30/23  7:11 PM  Result Value Ref Range   Troponin I (High Sensitivity) 24 (H) <18 ng/L   Recent Results (from the past 720 hours)  Resp panel by RT-PCR (RSV, Flu A&B, Covid) Anterior Nasal Swab     Status: None   Collection Time: 11/29/23  1:43 PM   Specimen: Anterior Nasal Swab  Result Value Ref Range Status   SARS Coronavirus 2 by RT PCR NEGATIVE NEGATIVE Final    Comment: (NOTE) SARS-CoV-2 target nucleic acids are NOT DETECTED.  The SARS-CoV-2 RNA  is generally detectable in upper respiratory specimens during the acute phase of infection. The lowest concentration of SARS-CoV-2 viral copies this assay can detect is 138 copies/mL. A negative result does not preclude SARS-Cov-2 infection and should not be used as the sole basis for treatment or other patient management decisions. A negative result may occur with  improper specimen collection/handling, submission of specimen other than nasopharyngeal swab,  presence of viral mutation(s) within the areas targeted by this assay, and inadequate number of viral copies(<138 copies/mL). A negative result must be combined with clinical observations, patient history, and epidemiological information. The expected result is Negative.  Fact Sheet for Patients:  BloggerCourse.com  Fact Sheet for Healthcare Providers:  SeriousBroker.it  This test is no t yet approved or cleared by the [place] FDA and  has been authorized for detection and/or diagnosis of SARS-CoV-2 by FDA under an Emergency Use Authorization (EUA). This EUA will remain  in effect (meaning this test can be used) for the duration of the COVID-19 declaration under Section 564(b)(1) of the Act, 21 U.S.C.section 360bbb-3(b)(1), unless the authorization is terminated  or revoked sooner.       Influenza A by PCR NEGATIVE NEGATIVE Final   Influenza B by PCR NEGATIVE NEGATIVE Final    Comment: (NOTE) The Xpert Xpress SARS-CoV-2/FLU/RSV plus assay is intended as an aid in the diagnosis of influenza from Nasopharyngeal swab specimens and should not be used as a sole basis for treatment. Nasal washings and aspirates are unacceptable for Xpert Xpress SARS-CoV-2/FLU/RSV testing.  Fact Sheet for Patients: BloggerCourse.com  Fact Sheet for Healthcare Providers: SeriousBroker.it  This test is not yet approved or cleared by the [place] FDA and has been authorized for detection and/or diagnosis of SARS-CoV-2 by FDA under an Emergency Use Authorization (EUA). This EUA will remain in effect (meaning this test can be used) for the duration of the COVID-19 declaration under Section 564(b)(1) of the Act, 21 U.S.C. section 360bbb-3(b)(1), unless the authorization is terminated or revoked.     Resp Syncytial Virus by PCR NEGATIVE NEGATIVE Final    Comment: (NOTE) Fact Sheet for  Patients: BloggerCourse.com  Fact Sheet for Healthcare Providers: SeriousBroker.it  This test is not yet approved or cleared by the [place] FDA and has been authorized for detection and/or diagnosis of SARS-CoV-2 by FDA under an Emergency Use Authorization (EUA). This EUA will remain in effect (meaning this test can be used) for the duration of the COVID-19 declaration under Section 564(b)(1) of the Act, 21 U.S.C. section 360bbb-3(b)(1), unless the authorization is terminated or revoked.  Performed at [facility], [address], [place], [place] [address]    CBC:    Latest Ref Rng & Units 11/29/2023    1:43 PM 01/05/2023    1:35 PM 05/05/2017    3:45 AM  CBC  WBC 4.0 - 10.5 K/uL 4.7  9.1  5.6   Hemoglobin 12.0 - 15.0 g/dL 19.1  47.8  29.5   Hematocrit 36.0 - 46.0 % 43.2  42.8  37.9   Platelets 150 - 400 K/uL 216  208  263    Basic Metabolic Panel: Recent Labs  Lab 11/29/23 1343 11/29/23 1930  NA 136 136  K 5.7* 3.7  CL 105 105  CO2 23 25  GLUCOSE 87 138*  BUN 19 18  CREATININE 0.78 0.81  CALCIUM 8.4* 8.2*   Creatinine: Lab Results  Component Value Date   CREATININE 0.81 11/29/2023   CREATININE 0.78 11/29/2023  CREATININE 0.80 01/05/2023   Liver Function Tests:    Latest Ref Rng & Units 11/30/2023    7:11 PM 11/25/2020    4:33 PM 11/20/2020   11:54 AM  Hepatic Function  Total Protein 6.5 - 8.1 g/dL 5.5  6.7  6.8   Albumin 3.5 - 5.0 g/dL 2.9  4.4  4.4   AST 15 - 41 U/L 20  24  23    ALT 0 - 44 U/L 14  17  13    Alk Phosphatase 38 - 126 U/L 70  97  93   Total Bilirubin 0.0 - 1.2 mg/dL 0.4  <1.6  <1.0   Bilirubin, Direct 0.0 - 0.2 mg/dL 0.1      Coagulation Profile: No results for input(s): "INR", "PROTIME" in the last 168 hours. Cardiac Enzymes: No results for input(s): "CKTOTAL", "CKMB", "CKMBINDEX", "TROPONINI" in the last 168 hours. BNP (last 3 results) No results for input(s):  "PROBNP" in the last 8760 hours. HbA1C: No results for input(s): "HGBA1C" in the last 72 hours. Lipid Profile: No results for input(s): "CHOL", "HDL", "LDLCALC", "TRIG", "CHOLHDL", "LDLDIRECT" in the last 72 hours.  Radiological Exams on Admission: DG Chest 2 View Result Date: 11/29/2023 CLINICAL DATA:  Chest pain. EXAM: CHEST - 2 VIEW COMPARISON:  X-ray 01/05/2023. CTA 10/21/2020. FINDINGS: Hyperinflation. There is some linear opacity at the left lung base likely scar or atelectasis. No consolidation, pneumothorax, effusion or edema. Normal cardiopericardial silhouette. Calcified, tortuous and ectatic aorta, unchanged from previous. Curvature of the spine with scattered degenerative changes. IMPRESSION: Hyperinflation.  Left basilar scar or atelectasis. Dilated, tortuous aorta. Electronically Signed   By: Karen Kays M.D.   On: 11/29/2023 17:08    Data Reviewed: Relevant notes from primary care and specialist visits, past discharge summaries as available in EHR, including Care Everywhere. Prior diagnostic testing as pertinent to current admission diagnoses, Updated medications and problem lists for reconciliation ED course, including vitals, labs, imaging, treatment and response to treatment,Triage notes, nursing and pharmacy notes and ED provider's notes Notable results as noted in HPI.Discussed case with EDMD/ ED APP/ or Specialty MD on call and as needed.  >>Assessment and Plan: * Chest pain Patient presenting retrosternal chest pain differentials include ischemic heart disease versus peptic ulcer disease. Will admit patient for monitoring and consult cardiology for evaluation with possible stress test. -.  Nitroglycerin as needed, morphine as needed, IV PPI. -  EKG as needed. -Continue patient's aspirin 81, atorvastatin 20. -Cardiology consult. -N.p.o. after midnight.    Acquired hypothyroidism Will continue patient on home dose of levothyroxine 75. Dose needs to be  verified. Will obtain a free T4 and a TSH.   Nonobstructive atherosclerosis of coronary artery Patient has a previous history of nonobstructive CAD with a heart cath in 2018 patient is followed closely by cardiology. Will continue patient on aspirin and statin therapy repeat troponin.  EKG as needed.  Cardiology consult.  Bipolar 1 disorder (HCC) Continue patient on Zoloft 100 mg.  Prolonged QT interval Monitor mag level.  Essential hypertension Vitals:   11/30/23 0500 11/30/23 0600 11/30/23 0700 11/30/23 0800  BP: (!) 149/100 (!) 150/82 137/86 (!) 130/114   11/30/23 1022 11/30/23 1100 11/30/23 1200 11/30/23 1300  BP: 105/72 (!) 139/90 124/86 132/77   11/30/23 1400 11/30/23 1410 11/30/23 1500 11/30/23 1643  BP: (!) 144/76 (!) 144/76 (!) 147/88 (!) 160/76  Home regimen consists of HCTZ 25, will continue patient on 12.5.  Monitor. BB not initiated due to HR in 60-70's.  DVT prophylaxis:  Heparin  Consults:  Cardiology paged: pending.  Advance Care Planning:    Code Status: Full Code   Family Communication:  None.  Disposition Plan:  Home.  Severity of Illness: The appropriate patient status for this patient is INPATIENT. Inpatient status is judged to be reasonable and necessary in order to provide the required intensity of service to ensure the patient's safety. The patient's presenting symptoms, physical exam findings, and initial radiographic and laboratory data in the context of their chronic comorbidities is felt to place them at high risk for further clinical deterioration. Furthermore, it is not anticipated that the patient will be medically stable for discharge from the hospital within 2 midnights of admission.   * I certify that at the point of admission it is my clinical judgment that the patient will require inpatient hospital care spanning beyond 2 midnights from the point of admission due to high intensity of service, high risk for further deterioration and  high frequency of surveillance required.*  Author: Gertha Calkin, MD 11/30/2023 8:07 PM  For on call review [email or web address].   Unresulted Labs (From admission, onward)     Start     Ordered   12/01/23 0500  CBC with Differential/Platelet  Tomorrow morning,   R       Question:  Specimen collection method  Answer:  Lab=Lab collect   11/30/23 1746   12/01/23 0500  Magnesium  Tomorrow morning,   R       Question:  Specimen collection method  Answer:  Lab=Lab collect   11/30/23 1746   12/01/23 0500  Phosphorus  Tomorrow morning,   R       Question:  Specimen collection method  Answer:  Lab=Lab collect   11/30/23 1746   12/01/23 0500  Comprehensive metabolic panel  Tomorrow morning,   R       Question:  Specimen collection method  Answer:  Lab=Lab collect   11/30/23 1746   11/30/23 2000  T4, free  Add-on,   AD       Question:  Specimen collection method  Answer:  Lab=Lab collect   11/30/23 1959   11/30/23 2000  TSH  Add-on,   AD       Question:  Specimen collection method  Answer:  Lab=Lab collect   11/30/23 1959   11/30/23 2000  Hemoglobin A1c  Add-on,   AD       Question:  Specimen collection method  Answer:  Lab=Lab collect   11/30/23 1959   11/30/23 1743  Rapid urine drug screen (hospital performed)  ONCE - STAT,   STAT        11/30/23 1746            Orders Placed This Encounter  Procedures   Resp panel by RT-PCR (RSV, Flu A&B, Covid) Anterior Nasal Swab   DG Chest 2 View   CBC   Basic metabolic panel   Basic metabolic panel   Hepatic function panel   CBC with Differential/Platelet   Magnesium   Phosphorus   Comprehensive metabolic panel   Rapid urine drug screen (hospital performed)   T4, free   TSH   Hemoglobin A1c   Diet NPO time specified Except for: Ice Chips   Cardiac Monitoring Continuous x 24 hours Indications for use: Other; other indications for use: Chest pain   Patient has an active order for admit to inpatient/place in observation   Refer to Sidebar  Report Refer to ICU, Med-Surg,  Progressive, and Step-Down Mobility Protocol Sidebars   Apply Angina, Rule Out Myocardial Infarction Care Plan   Vital signs with O2 sat q4 hours x 24 hours, then q shift   RN may order Cardiology PRN Orders utilizing "Cardiology PRN medications" (through manage orders) for the following patient needs:   Bed rest   Full code   Consult to hospitalist   Consult to wound, ostomy, continence   ED EKG   EKG 12-Lead   EKG 12-Lead (at 6am)   EKG   EKG   EKG 12-Lead   Type and screen   Place in observation (patient's expected length of stay will be less than 2 midnights)

## 2023-11-30 NOTE — Assessment & Plan Note (Signed)
 Will continue patient on home dose of levothyroxine 75. Dose needs to be verified. Will obtain a free T4 and a TSH.

## 2023-11-30 NOTE — ED Notes (Signed)
 EDP made aware pt is hurting and toplace PRN orders in for pt.

## 2023-12-01 ENCOUNTER — Observation Stay (HOSPITAL_COMMUNITY)

## 2023-12-01 DIAGNOSIS — R079 Chest pain, unspecified: Secondary | ICD-10-CM | POA: Diagnosis not present

## 2023-12-01 DIAGNOSIS — R52 Pain, unspecified: Secondary | ICD-10-CM | POA: Diagnosis not present

## 2023-12-01 DIAGNOSIS — I361 Nonrheumatic tricuspid (valve) insufficiency: Secondary | ICD-10-CM

## 2023-12-01 DIAGNOSIS — Z1152 Encounter for screening for COVID-19: Secondary | ICD-10-CM | POA: Diagnosis not present

## 2023-12-01 DIAGNOSIS — J069 Acute upper respiratory infection, unspecified: Secondary | ICD-10-CM | POA: Diagnosis not present

## 2023-12-01 LAB — CBC WITH DIFFERENTIAL/PLATELET
Abs Immature Granulocytes: 0.01 10*3/uL (ref 0.00–0.07)
Basophils Absolute: 0 10*3/uL (ref 0.0–0.1)
Basophils Relative: 0 %
Eosinophils Absolute: 0.2 10*3/uL (ref 0.0–0.5)
Eosinophils Relative: 4 %
HCT: 41 % (ref 36.0–46.0)
Hemoglobin: 13.6 g/dL (ref 12.0–15.0)
Immature Granulocytes: 0 %
Lymphocytes Relative: 43 %
Lymphs Abs: 2 10*3/uL (ref 0.7–4.0)
MCH: 26.7 pg (ref 26.0–34.0)
MCHC: 33.2 g/dL (ref 30.0–36.0)
MCV: 80.4 fL (ref 80.0–100.0)
Monocytes Absolute: 0.4 10*3/uL (ref 0.1–1.0)
Monocytes Relative: 9 %
Neutro Abs: 2.1 10*3/uL (ref 1.7–7.7)
Neutrophils Relative %: 44 %
Platelets: 232 10*3/uL (ref 150–400)
RBC: 5.1 MIL/uL (ref 3.87–5.11)
RDW: 16.3 % — ABNORMAL HIGH (ref 11.5–15.5)
WBC: 4.7 10*3/uL (ref 4.0–10.5)
nRBC: 0 % (ref 0.0–0.2)

## 2023-12-01 LAB — COMPREHENSIVE METABOLIC PANEL
ALT: 15 U/L (ref 0–44)
AST: 20 U/L (ref 15–41)
Albumin: 2.8 g/dL — ABNORMAL LOW (ref 3.5–5.0)
Alkaline Phosphatase: 67 U/L (ref 38–126)
Anion gap: 6 (ref 5–15)
BUN: 10 mg/dL (ref 8–23)
CO2: 23 mmol/L (ref 22–32)
Calcium: 8.4 mg/dL — ABNORMAL LOW (ref 8.9–10.3)
Chloride: 106 mmol/L (ref 98–111)
Creatinine, Ser: 0.7 mg/dL (ref 0.44–1.00)
GFR, Estimated: >60 mL/min (ref >60)
Glucose, Bld: 85 mg/dL (ref 70–99)
Potassium: 3.5 mmol/L (ref 3.5–5.1)
Sodium: 135 mmol/L (ref 135–145)
Total Bilirubin: 0.3 mg/dL (ref 0.0–1.2)
Total Protein: 5.1 g/dL — ABNORMAL LOW (ref 6.5–8.1)

## 2023-12-01 LAB — ECHOCARDIOGRAM COMPLETE
AR max vel: 2.18 cm2
AV Area VTI: 2.12 cm2
AV Area mean vel: 2.05 cm2
AV Mean grad: 4 mmHg
AV Peak grad: 8.1 mmHg
Ao pk vel: 1.42 m/s
Area-P 1/2: 2.76 cm2
Calc EF: 63.8 %
Height: 63 in
MV VTI: 2.61 cm2
P 1/2 time: 659 ms
S' Lateral: 2.6 cm
Single Plane A2C EF: 68.5 %
Single Plane A4C EF: 68 %
Weight: 2264 [oz_av]

## 2023-12-01 LAB — TYPE AND SCREEN
ABO/RH(D): O POS
Antibody Screen: NEGATIVE

## 2023-12-01 LAB — MAGNESIUM: Magnesium: 1.7 mg/dL (ref 1.7–2.4)

## 2023-12-01 LAB — PHOSPHORUS: Phosphorus: 2.1 mg/dL — ABNORMAL LOW (ref 2.5–4.6)

## 2023-12-01 MED ORDER — PANTOPRAZOLE SODIUM 40 MG PO TBEC
40.0000 mg | DELAYED_RELEASE_TABLET | Freq: Every day | ORAL | Status: DC
  Administered 2023-12-01: 40 mg via ORAL
  Filled 2023-12-01: qty 1

## 2023-12-01 MED ORDER — AMLODIPINE BESYLATE 10 MG PO TABS
10.0000 mg | ORAL_TABLET | Freq: Every day | ORAL | Status: DC
Start: 2023-12-01 — End: 2023-12-01
  Administered 2023-12-01: 10 mg via ORAL
  Filled 2023-12-01: qty 1

## 2023-12-01 MED ORDER — NAPROXEN 250 MG PO TABS
250.0000 mg | ORAL_TABLET | Freq: Two times a day (BID) | ORAL | Status: DC
  Filled 2023-12-01 (×2): qty 1

## 2023-12-01 MED ORDER — NAPROXEN 375 MG PO TABS
375.0000 mg | ORAL_TABLET | Freq: Two times a day (BID) | ORAL | Status: DC
  Filled 2023-12-01: qty 1

## 2023-12-01 MED ORDER — MUPIROCIN 2 % EX OINT
TOPICAL_OINTMENT | Freq: Two times a day (BID) | CUTANEOUS | Status: DC
  Filled 2023-12-01: qty 22

## 2023-12-01 MED ORDER — AMLODIPINE BESYLATE 10 MG PO TABS: 10.0000 mg | ORAL_TABLET | Freq: Every day | ORAL | 1 refills | Status: AC

## 2023-12-01 MED ORDER — NAPROXEN SODIUM 220 MG PO TABS: 220.0000 mg | ORAL_TABLET | Freq: Two times a day (BID) | ORAL | Status: AC | PRN

## 2023-12-01 NOTE — Consult Note (Signed)
 WOC Nurse Consult Note: patient had left 2nd toenail removed by Dr. Charlsie Merles podiatrist 11/24/2023; I have reviewed his note and placed postop wound care orders per his Discharge instructions  Reason for Consult: toenail removal L 2nd toe  Wound type: full thickness post nail removal as above  Pressure Injury POA: NA  Measurement: as per nursing flowsheet  Wound bed: as per nursing flowsheet  Drainage (amount, consistency, odor) as per flowsheet  Periwound: intact  Dressing procedure/placement/frequency: Cleanse L 2nd toenail bed with soap and water (apply to gauze and wash gently), apply Mupirocin ointment to nail bed, cover with a small piece of Telfa nonstick dressing and tape to secure.  Perform 2 times a day.   Patient should continue to follow postop instructions by podiatry until follow-up with their office.  Patient is to wear an open toed shoe per their instructions as well.    POC discussed with bedside nurse. WOC team will not follow. Re-consult if further needs arise.   Thank you,    Priscella Mann MSN, RN-BC, [employer] [phone]

## 2023-12-01 NOTE — Progress Notes (Signed)
  Echocardiogram 2D Echocardiogram has been performed.  Ocie Doyne RDCS 12/01/2023, 3:49 PM

## 2023-12-01 NOTE — Progress Notes (Addendum)
 PROGRESS NOTE    Emily Murray  MVH:[id number] DOB: 16-Mar-1951 DOA: 11/29/2023 PCP: Corine Shelter, MD  72/F with hypertension, hypothyroidism, bipolar disorder, polysubstance abuse developed severe myalgia and URI symptoms 3 days ago-namely congestion runny nose and headache -Due to persistence of her symptoms she presented to the ED, she also noted some pain on both sides of her chest and her back along with diffuse body pains and aches -In the ED vital signs were stable, EKG without acute ST-T wave changes, troponin was 27, she was admitted overnight for observation, UDS positive for cocaine  Subjective: -Continues to have some bodyaches all over primarily in her back, legs and thighs, some headache, denies chest pain or dyspnea  Assessment and Plan:   Diffuse myalgias URI -Her symptoms are viral in nature, has diffuse bodyaches, congestion and headache, along with myalgias, flu and COVID PCR were negative -Do not suspect ACS -Does not need stress testing at this time, will check 2D echo for completeness -Add naproxen, supportive care, DC home pending ECHO  Acquired hypothyroidism -Continue Synthroid   Mild nonobstructive atherosclerosis of coronary artery -Cath in 2018 with minimal luminal irregularities -Do not suspect ACS at this time  Polysubstance, cocaine abuse Counseled  Bipolar 1 disorder (HCC) Continue patient on Zoloft 100 mg.  Prolonged QT interval Monitor mag level.  Essential hypertension -Add amlodipine  Anxiety, bipolar disorder Continue Lamictal, Klonopin, Zoloft per home regimen   DVT prophylaxis: Heparin subcutaneous Code Status: Full code Family Communication: None has not Disposition Plan: Home later today pending echo  Consultants:    Procedures:   Antimicrobials:    Objective: Vitals:   12/01/23 0428 12/01/23 0429 12/01/23 0430 12/01/23 0705  BP: (!) 150/102   (!) 160/102  Pulse: 63 (!) 58 61 (!) 57  Resp: 14 20 14 17    Temp: 98.7 F (37.1 C)   98.3 F (36.8 C)  TempSrc: Oral   Oral  SpO2: 98% 96% 97% 100%  Weight: 64.2 kg     Height:        Intake/Output Summary (Last 24 hours) at 12/01/2023 1028 Last data filed at 11/30/2023 2057 Gross per 24 hour  Intake --  Output 300 ml  Net -300 ml   Filed Weights   11/30/23 1643 12/01/23 0428  Weight: 66.5 kg 64.2 kg    Examination:  General exam: Appears calm and comfortable  Respiratory system: Clear to auscultation Cardiovascular system: S1 & S2 heard, RRR.  Abd: nondistended, soft and nontender.Normal bowel sounds heard. Central nervous system: Alert and oriented. No focal neurological deficits. Extremities: no edema Skin: No rashes Psychiatry:  Mood & affect appropriate.     Data Reviewed:   CBC: Recent Labs  Lab 11/29/23 1343 12/01/23 0259  WBC 4.7 4.7  NEUTROABS  --  2.1  HGB 14.1 13.6  HCT 43.2 41.0  MCV 80.6 80.4  PLT 216 232   Basic Metabolic Panel: Recent Labs  Lab 11/29/23 1343 11/29/23 1930 12/01/23 0259  NA 136 136 135  K 5.7* 3.7 3.5  CL 105 105 106  CO2 23 25 23   GLUCOSE 87 138* 85  BUN 19 18 10   CREATININE 0.78 0.81 0.70  CALCIUM 8.4* 8.2* 8.4*  MG  --   --  1.7  PHOS  --   --  2.1*   GFR: Estimated Creatinine Clearance: 55.6 mL/min (by C-G formula based on SCr of 0.7 mg/dL). Liver Function Tests: Recent Labs  Lab 11/30/23 1911 12/01/23 0259  AST 20  20  ALT 14 15  ALKPHOS 70 67  BILITOT 0.4 0.3  PROT 5.5* 5.1*  ALBUMIN 2.9* 2.8*   No results for input(s): "LIPASE", "AMYLASE" in the last 168 hours. No results for input(s): "AMMONIA" in the last 168 hours. Coagulation Profile: No results for input(s): "INR", "PROTIME" in the last 168 hours. Cardiac Enzymes: No results for input(s): "CKTOTAL", "CKMB", "CKMBINDEX", "TROPONINI" in the last 168 hours. BNP (last 3 results) No results for input(s): "PROBNP" in the last 8760 hours. HbA1C: Recent Labs    11/30/23 2312  HGBA1C 5.6   CBG: No  results for input(s): "GLUCAP" in the last 168 hours. Lipid Profile: No results for input(s): "CHOL", "HDL", "LDLCALC", "TRIG", "CHOLHDL", "LDLDIRECT" in the last 72 hours. Thyroid Function Tests: Recent Labs    11/30/23 1906  TSH 5.389*  FREET4 0.70   Anemia Panel: No results for input(s): "VITAMINB12", "FOLATE", "FERRITIN", "TIBC", "IRON", "RETICCTPCT" in the last 72 hours. Urine analysis:    Component Value Date/Time   COLORURINE YELLOW 05/02/2017 1953   APPEARANCEUR CLEAR 05/02/2017 1953   LABSPEC 1.017 05/02/2017 1953   PHURINE 6.0 05/02/2017 1953   GLUCOSEU NEGATIVE 05/02/2017 1953   HGBUR NEGATIVE 05/02/2017 1953   BILIRUBINUR NEGATIVE 05/02/2017 1953   KETONESUR NEGATIVE 05/02/2017 1953   PROTEINUR NEGATIVE 05/02/2017 1953   UROBILINOGEN 0.2 08/02/2014 1555   NITRITE NEGATIVE 05/02/2017 1953   LEUKOCYTESUR NEGATIVE 05/02/2017 1953   Sepsis Labs: @LABRCNTIP (procalcitonin:4,lacticidven:4)  ) Recent Results (from the past 240 hours)  Resp panel by RT-PCR (RSV, Flu A&B, Covid) Anterior Nasal Swab     Status: None   Collection Time: 11/29/23  1:43 PM   Specimen: Anterior Nasal Swab  Result Value Ref Range Status   SARS Coronavirus 2 by RT PCR NEGATIVE NEGATIVE Final    Comment: (NOTE) SARS-CoV-2 target nucleic acids are NOT DETECTED.  The SARS-CoV-2 RNA is generally detectable in upper respiratory specimens during the acute phase of infection. The lowest concentration of SARS-CoV-2 viral copies this assay can detect is 138 copies/mL. A negative result does not preclude SARS-Cov-2 infection and should not be used as the sole basis for treatment or other patient management decisions. A negative result may occur with  improper specimen collection/handling, submission of specimen other than nasopharyngeal swab, presence of viral mutation(s) within the areas targeted by this assay, and inadequate number of viral copies(<138 copies/mL). A negative result must be  combined with clinical observations, patient history, and epidemiological information. The expected result is Negative.  Fact Sheet for Patients:  BloggerCourse.com  Fact Sheet for Healthcare Providers:  SeriousBroker.it  This test is no t yet approved or cleared by the [place] FDA and  has been authorized for detection and/or diagnosis of SARS-CoV-2 by FDA under an Emergency Use Authorization (EUA). This EUA will remain  in effect (meaning this test can be used) for the duration of the COVID-19 declaration under Section 564(b)(1) of the Act, 21 U.S.C.section 360bbb-3(b)(1), unless the authorization is terminated  or revoked sooner.       Influenza A by PCR NEGATIVE NEGATIVE Final   Influenza B by PCR NEGATIVE NEGATIVE Final    Comment: (NOTE) The Xpert Xpress SARS-CoV-2/FLU/RSV plus assay is intended as an aid in the diagnosis of influenza from Nasopharyngeal swab specimens and should not be used as a sole basis for treatment. Nasal washings and aspirates are unacceptable for Xpert Xpress SARS-CoV-2/FLU/RSV testing.  Fact Sheet for Patients: BloggerCourse.com  Fact Sheet for Healthcare Providers: SeriousBroker.it  This test  is not yet approved or cleared by the [place] and has been authorized for detection and/or diagnosis of SARS-CoV-2 by FDA under an Emergency Use Authorization (EUA). This EUA will remain in effect (meaning this test can be used) for the duration of the COVID-19 declaration under Section 564(b)(1) of the Act, 21 U.S.C. section 360bbb-3(b)(1), unless the authorization is terminated or revoked.     Resp Syncytial Virus by PCR NEGATIVE NEGATIVE Final    Comment: (NOTE) Fact Sheet for Patients: BloggerCourse.com  Fact Sheet for Healthcare Providers: SeriousBroker.it  This test is not yet  approved or cleared by the [place] FDA and has been authorized for detection and/or diagnosis of SARS-CoV-2 by FDA under an Emergency Use Authorization (EUA). This EUA will remain in effect (meaning this test can be used) for the duration of the COVID-19 declaration under Section 564(b)(1) of the Act, 21 U.S.C. section 360bbb-3(b)(1), unless the authorization is terminated or revoked.  Performed at [facility], [address], [place], [place] [address]      Radiology Studies: DG Chest 2 View Result Date: 11/29/2023 CLINICAL DATA:  Chest pain. EXAM: CHEST - 2 VIEW COMPARISON:  X-ray 01/05/2023. CTA 10/21/2020. FINDINGS: Hyperinflation. There is some linear opacity at the left lung base likely scar or atelectasis. No consolidation, pneumothorax, effusion or edema. Normal cardiopericardial silhouette. Calcified, tortuous and ectatic aorta, unchanged from previous. Curvature of the spine with scattered degenerative changes. IMPRESSION: Hyperinflation.  Left basilar scar or atelectasis. Dilated, tortuous aorta. Electronically Signed   By: Karen Kays M.D.   On: 11/29/2023 17:08     Scheduled Meds:  aspirin EC  81 mg Oral Daily   atorvastatin  40 mg Oral q1800   clonazePAM  0.5 mg Oral Daily   heparin  5,000 Units Subcutaneous Q8H   lamoTRIgine  100 mg Oral q morning   levothyroxine  75 mcg Oral Q0600   mupirocin ointment   Topical BID   naproxen  375 mg Oral BID WC   pantoprazole  40 mg Oral Q1200   sertraline  100 mg Oral Daily   Continuous Infusions:   LOS: 0 days    Time spent:    Zannie Cove, MD [facility]   12/01/2023, 10:28 AM

## 2023-12-01 NOTE — TOC CM/SW Note (Signed)
 Transition of Care [facility]) - Inpatient Brief Assessment   Patient Details  Name: CALYPSO HAGARTY MRN: [id number] Date of Birth: 13-Oct-1950  Transition of Care [facility]) CM/SW Contact:    Leone Haven, RN Phone Number: 12/01/2023, 12:00 PM   Clinical Narrative: From home with spouse, has PCP and insurance on file, states has no HH services in place at this time or DME at home.  States family member will transport them home at [employer] and family is support system, states gets medications from [facility] on [address].  Pta self ambulatory .   Transition of Care Asessment: Insurance and Status: Insurance coverage has been reviewed Patient has primary care physician: Yes Home environment has been reviewed: home with spouse Prior level of function:: indep Prior/Current Home Services: No current home services Social Drivers of Health Review: SDOH reviewed no interventions necessary Readmission risk has been reviewed: Yes Transition of care needs: transition of care needs identified, TOC will continue to follow

## 2023-12-01 NOTE — Plan of Care (Signed)
  Problem: Education: Goal: Knowledge of General Education information will improve Description: Including pain rating scale, medication(s)/side effects and non-pharmacologic comfort measures Outcome: Progressing   Problem: Health Behavior/Discharge Planning: Goal: Ability to manage health-related needs will improve Outcome: Progressing   Problem: Clinical Measurements: Goal: Cardiovascular complication will be avoided Outcome: Progressing   Problem: Coping: Goal: Level of anxiety will decrease Outcome: Progressing   

## 2023-12-01 NOTE — Discharge Summary (Signed)
 Physician Discharge Summary  PANG ROBERS UJW:[id number] DOB: 02-19-1951 DOA: 11/29/2023  PCP: Corine Shelter, MD  Admit date: 11/29/2023 Discharge date: 12/01/2023  Time spent: 45 minutes  Recommendations for Outpatient Follow-up:  PCP in 1 week   Discharge Diagnoses:    URI   Chest pain   Essential hypertension   Prolonged QT interval   Bipolar 1 disorder (HCC)   Nonobstructive atherosclerosis of coronary artery   Acquired hypothyroidism   Discharge Condition: stable  Diet recommendation: low sodium  Filed Weights   11/30/23 1643 12/01/23 0428  Weight: 66.5 kg 64.2 kg    History of present illness:  72/F with hypertension, hypothyroidism, bipolar disorder, polysubstance abuse developed severe myalgia and URI symptoms 3 days ago-namely congestion runny nose and headache -Due to persistence of her symptoms she presented to the ED, she also noted some pain on both sides of her chest and her back along with diffuse body pains and aches -In the ED vital signs were stable, EKG without acute ST-T wave changes, troponin was 27, she was admitted overnight for observation, UDS positive for cocaine  Hospital Course:   Diffuse myalgias URI Chest pain -Her symptoms are viral in nature, has diffuse bodyaches, congestion and headache, along with myalgias, flu and COVID PCR were negative -Do not suspect ACS -Does not need stress testing at this time, 2D echo with nromal EF and no Wall motion abnormality -Add naproxen, supportive care, DC home today -FU wit PCP in 1 week   Acquired hypothyroidism -Continue Synthroid     Mild nonobstructive atherosclerosis of coronary artery -Cath in 2018 with minimal luminal irregularities -Do not suspect ACS at this time   Polysubstance, cocaine abuse Counseled   Bipolar 1 disorder (HCC) Continue patient on Zoloft 100 mg.  Essential hypertension -Add amlodipine   Anxiety, bipolar disorder Continue Lamictal, Klonopin, Zoloft  per home regimen  Discharge Exam: Vitals:   12/01/23 0705 12/01/23 1409  BP: (!) 160/102 138/77  Pulse: (!) 57 64  Resp: 17 18  Temp: 98.3 F (36.8 C) 98 F (36.7 C)  SpO2: 100%     Gen: Awake, Alert, Oriented X 3,  HEENT: no JVD Lungs: Good air movement bilaterally, CTAB CVS: S1S2/RRR Abd: soft, Non tender, non distended, BS present Extremities: No edema Skin: no new rashes on exposed skin   Discharge Instructions   Discharge Instructions     Diet - low sodium heart healthy   Complete by: As directed    Increase activity slowly   Complete by: As directed    No wound care   Complete by: As directed       Allergies as of 12/01/2023       Reactions   Azo Cranberry Tilman Neat Extract]    Caused numbness in lip   Cranberry Other (See Comments)   Patient took Cranberry tablets and her lips "went numb"        Medication List     STOP taking these medications    ibuprofen 200 MG tablet Commonly known as: ADVIL       TAKE these medications    amLODipine 10 MG tablet Commonly known as: NORVASC Take 1 tablet (10 mg total) by mouth daily. Start taking on: December 02, 2023   aspirin EC 81 MG tablet Take 1 tablet (81 mg total) by mouth daily.   atorvastatin 20 MG tablet Commonly known as: LIPITOR Take 1 tablet (20 mg total) by mouth at bedtime.   brompheniramine-pseudoephedrine-DM 30-2-10 MG/5ML syrup Take  10 mLs by mouth 4 (four) times daily as needed.   clonazePAM 0.5 MG tablet Commonly known as: KLONOPIN Take 0.5 mg by mouth daily as needed for anxiety. For aneixty   estrogens (conjugated) 0.625 MG tablet Commonly known as: PREMARIN Take 5 mg by mouth daily. Take daily for 21 days then do not take for 7 days.   eszopiclone 2 MG Tabs tablet Commonly known as: LUNESTA Take 2 mg by mouth at bedtime as needed.   folic acid 1 MG tablet Commonly known as: FOLVITE Take 1 mg by mouth daily.   hydrochlorothiazide 25 MG tablet Commonly known as:  HYDRODIURIL TAKE 1 TABLET BY MOUTH EVERY DAY IN THE MORNING   lamoTRIgine 100 MG tablet Commonly known as: LAMICTAL Take 100 mg by mouth every morning.   levothyroxine 150 MCG tablet Commonly known as: SYNTHROID Take 0.75 mcg by mouth daily before breakfast.   lurasidone 20 MG Tabs tablet Commonly known as: LATUDA Take 20 mg by mouth daily.   naproxen sodium 220 MG tablet Commonly known as: ALEVE Take 1 tablet (220 mg total) by mouth 2 (two) times daily as needed for up to 3 days. What changed: when to take this   sertraline 100 MG tablet Commonly known as: ZOLOFT Take 200 mg by mouth daily after breakfast.   Vitamin D (Ergocalciferol) 1.25 MG (50000 UNIT) Caps capsule Commonly known as: DRISDOL Take 50,000 Units by mouth every 7 (seven) days.       Allergies  Allergen Reactions   Azo Cranberry [Cranberry Extract]     Caused numbness in lip   Cranberry Other (See Comments)    Patient took Cranberry tablets and her lips "went numb"    Follow-up Information     Corine Shelter, MD Follow up.   Specialty: Pulmonary Disease Why: Please follow up in a week. Contact information: [address] [place] [address] [phone]                  The results of significant diagnostics from this hospitalization (including imaging, microbiology, ancillary and laboratory) are listed below for reference.    Significant Diagnostic Studies: DG Chest 2 View Result Date: 11/29/2023 CLINICAL DATA:  Chest pain. EXAM: CHEST - 2 VIEW COMPARISON:  X-ray 01/05/2023. CTA 10/21/2020. FINDINGS: Hyperinflation. There is some linear opacity at the left lung base likely scar or atelectasis. No consolidation, pneumothorax, effusion or edema. Normal cardiopericardial silhouette. Calcified, tortuous and ectatic aorta, unchanged from previous. Curvature of the spine with scattered degenerative changes. IMPRESSION: Hyperinflation.  Left basilar scar or atelectasis. Dilated,  tortuous aorta. Electronically Signed   By: Karen Kays M.D.   On: 11/29/2023 17:08    Microbiology: Recent Results (from the past 240 hours)  Resp panel by RT-PCR (RSV, Flu A&B, Covid) Anterior Nasal Swab     Status: None   Collection Time: 11/29/23  1:43 PM   Specimen: Anterior Nasal Swab  Result Value Ref Range Status   SARS Coronavirus 2 by RT PCR NEGATIVE NEGATIVE Final    Comment: (NOTE) SARS-CoV-2 target nucleic acids are NOT DETECTED.  The SARS-CoV-2 RNA is generally detectable in upper respiratory specimens during the acute phase of infection. The lowest concentration of SARS-CoV-2 viral copies this assay can detect is 138 copies/mL. A negative result does not preclude SARS-Cov-2 infection and should not be used as the sole basis for treatment or other patient management decisions. A negative result may occur with  improper specimen collection/handling, submission of specimen other  than nasopharyngeal swab, presence of viral mutation(s) within the areas targeted by this assay, and inadequate number of viral copies(<138 copies/mL). A negative result must be combined with clinical observations, patient history, and epidemiological information. The expected result is Negative.  Fact Sheet for Patients:  BloggerCourse.com  Fact Sheet for Healthcare Providers:  SeriousBroker.it  This test is no t yet approved or cleared by the [place] FDA and  has been authorized for detection and/or diagnosis of SARS-CoV-2 by FDA under an Emergency Use Authorization (EUA). This EUA will remain  in effect (meaning this test can be used) for the duration of the COVID-19 declaration under Section 564(b)(1) of the Act, 21 U.S.C.section 360bbb-3(b)(1), unless the authorization is terminated  or revoked sooner.       Influenza A by PCR NEGATIVE NEGATIVE Final   Influenza B by PCR NEGATIVE NEGATIVE Final    Comment: (NOTE) The Xpert  Xpress SARS-CoV-2/FLU/RSV plus assay is intended as an aid in the diagnosis of influenza from Nasopharyngeal swab specimens and should not be used as a sole basis for treatment. Nasal washings and aspirates are unacceptable for Xpert Xpress SARS-CoV-2/FLU/RSV testing.  Fact Sheet for Patients: BloggerCourse.com  Fact Sheet for Healthcare Providers: SeriousBroker.it  This test is not yet approved or cleared by the [place] FDA and has been authorized for detection and/or diagnosis of SARS-CoV-2 by FDA under an Emergency Use Authorization (EUA). This EUA will remain in effect (meaning this test can be used) for the duration of the COVID-19 declaration under Section 564(b)(1) of the Act, 21 U.S.C. section 360bbb-3(b)(1), unless the authorization is terminated or revoked.     Resp Syncytial Virus by PCR NEGATIVE NEGATIVE Final    Comment: (NOTE) Fact Sheet for Patients: BloggerCourse.com  Fact Sheet for Healthcare Providers: SeriousBroker.it  This test is not yet approved or cleared by the [place] FDA and has been authorized for detection and/or diagnosis of SARS-CoV-2 by FDA under an Emergency Use Authorization (EUA). This EUA will remain in effect (meaning this test can be used) for the duration of the COVID-19 declaration under Section 564(b)(1) of the Act, 21 U.S.C. section 360bbb-3(b)(1), unless the authorization is terminated or revoked.  Performed at [facility], [address], [place], [place] [address]      Labs: Basic Metabolic Panel: Recent Labs  Lab 11/29/23 1343 11/29/23 1930 12/01/23 0259  NA 136 136 135  K 5.7* 3.7 3.5  CL 105 105 106  CO2 23 25 23   GLUCOSE 87 138* 85  BUN 19 18 10   CREATININE 0.78 0.81 0.70  CALCIUM 8.4* 8.2* 8.4*  MG  --   --  1.7  PHOS  --   --  2.1*   Liver Function Tests: Recent Labs  Lab  11/30/23 1911 12/01/23 0259  AST 20 20  ALT 14 15  ALKPHOS 70 67  BILITOT 0.4 0.3  PROT 5.5* 5.1*  ALBUMIN 2.9* 2.8*   No results for input(s): "LIPASE", "AMYLASE" in the last 168 hours. No results for input(s): "AMMONIA" in the last 168 hours. CBC: Recent Labs  Lab 11/29/23 1343 12/01/23 0259  WBC 4.7 4.7  NEUTROABS  --  2.1  HGB 14.1 13.6  HCT 43.2 41.0  MCV 80.6 80.4  PLT 216 232   Cardiac Enzymes: No results for input(s): "CKTOTAL", "CKMB", "CKMBINDEX", "TROPONINI" in the last 168 hours. BNP: BNP (last 3 results) Recent Labs    01/05/23 1400  BNP 138.5*    ProBNP (last 3 results)  No results for input(s): "PROBNP" in the last 8760 hours.  CBG: No results for input(s): "GLUCAP" in the last 168 hours.     Signed:  Zannie Cove MD.  [facility] 12/01/2023, 4:06 PM

## 2023-12-01 NOTE — Plan of Care (Signed)

## 2023-12-01 NOTE — TOC Transition Note (Signed)
 Transition of Care [facility]) - Discharge Note   Patient Details  Name: Emily Murray MRN: [id number] Date of Birth: 08-26-1951  [facility]) CM/SW Contact:  Leone Haven, RN Phone Number: 12/01/2023, 12:01 PM   Clinical Narrative:    For possible dc today,  for a stress test today if ok will dc home.  She will need a work note,          Patient Goals and CMS Choice            Discharge Placement                       Discharge Plan and Services Additional resources added to the After Visit Summary for                                       Social Drivers of Health (SDOH) Interventions SDOH Screenings   Food Insecurity: No Food Insecurity (11/30/2023)  Housing: Low Risk  (11/30/2023)  Transportation Needs: No Transportation Needs (11/30/2023)  Utilities: Not At Risk (11/30/2023)  Social Connections: Moderately Integrated (11/30/2023)  Tobacco Use: Medium Risk (11/29/2023)     Readmission Risk Interventions     No data to display

## 2023-12-01 NOTE — Care Management Obs Status (Signed)
 MEDICARE OBSERVATION STATUS NOTIFICATION   Patient Details  Name: Emily Murray MRN: [id number] Date of Birth: 1951-03-14   Medicare Observation Status Notification Given:  Yes    Leone Haven, RN 12/01/2023, 10:21 AM

## 2023-12-01 NOTE — Progress Notes (Signed)
 Mobility Specialist Progress Note:   12/01/23 1100  Mobility  Activity Ambulated with assistance in hallway  Level of Assistance Standby assist, set-up cues, supervision of patient - no hands on  Assistive Device None  Distance Ambulated (ft) 150 ft  Activity Response Tolerated well  Mobility Referral Yes  Mobility visit 1 Mobility  Mobility Specialist Start Time (ACUTE ONLY) 1100  Mobility Specialist Stop Time (ACUTE ONLY) 1115  Mobility Specialist Time Calculation (min) (ACUTE ONLY) 15 min   Pt agreeable to mobility session. Required no physical assistance throughout. HR in 70-80s throughout. Back in bed with all needs met.   Addison Lank Mobility Specialist Please contact via SecureChat or  Rehab office at [phone]
